# Patient Record
Sex: Male | Born: 2000 | Race: Black or African American | Hispanic: No | Marital: Single | Smoking: Current every day smoker
Health system: Southern US, Community
[De-identification: ages and names within clinical notes are randomized; demographics above are authoritative.]

## PROBLEM LIST (undated history)

## (undated) DIAGNOSIS — I10 Essential (primary) hypertension: Secondary | ICD-10-CM

## (undated) DIAGNOSIS — R011 Cardiac murmur, unspecified: Secondary | ICD-10-CM

## (undated) HISTORY — PX: NO PAST SURGERIES: SHX2092

## (undated) HISTORY — DX: Cardiac murmur, unspecified: R01.1

---
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
## 2020-01-31 ENCOUNTER — Ambulatory Visit
Admission: EM | Admit: 2020-01-31 | Discharge: 2020-01-31 | Disposition: A | Discharge status: Home / Self Care | Payer: Medicaid Other | Attending: Emergency Medicine | Admitting: Emergency Medicine

## 2020-01-31 ENCOUNTER — Other Ambulatory Visit: Payer: Self-pay

## 2020-01-31 DIAGNOSIS — L0291 Cutaneous abscess, unspecified: Secondary | ICD-10-CM

## 2020-01-31 HISTORY — DX: Essential (primary) hypertension: I10

## 2020-01-31 MED ORDER — SULFAMETHOXAZOLE-TRIMETHOPRIM 800-160 MG PO TABS: 1.0000 | ORAL_TABLET | Freq: Two times a day (BID) | ORAL | 0 refills | Status: AC

## 2020-01-31 NOTE — ED Triage Notes (Signed)
Patient complains of lump on left testicle x 2-3 days. Patient states that area is painful worse to touch.

## 2020-01-31 NOTE — Discharge Instructions (Signed)
Take the antibiotic as directed.  Encourage drainage from your abscess.    Keep your wound clean and dry.  Wash it gently twice a day with soap and water.      Return here if you see signs of worsening infection, such as increased pain, redness, warmth, fever, chills, or other concerning symptoms.    Follow up with your primary care provider or return here if your abscess is not improving.

## 2020-01-31 NOTE — ED Provider Notes (Signed)
[facility]    CSN: [id number] Arrival date & time: 01/31/20  1536      History   Chief Complaint Chief Complaint  Patient presents with  . Testicle Pain    left    HPI Darren Jones is a 19 y.o. male.   Patient presents with a "lump" on his left scrotum x 3 days.  He states it is painful to touch.  He denies fever or chills.  No drainage from the area.  No penile discharge or testicular pain.  No abdominal pain, dysuria, back pain.  No other symptoms.  No treatments attempted at home.   The history is provided by the patient.    Past Medical History:  Diagnosis Date  . Hypertension     There are no problems to display for this patient.   Past Surgical History:  Procedure Laterality Date  . NO PAST SURGERIES         Home Medications    Prior to Admission medications   Medication Sig Start Date End Date Taking? Authorizing Provider  sulfamethoxazole-trimethoprim (BACTRIM DS) 800-160 MG tablet Take 1 tablet by mouth 2 (two) times daily for 7 days. 01/31/20 02/07/20  Mickie Bail, NP    Family History Family History  Problem Relation Age of Onset  . Healthy Mother   . Hypertension Father   . Diabetes Father     Social History Social History   Tobacco Use  . Smoking status: Current Every Day Smoker    Packs/day: 0.50    Types: Cigarettes  . Smokeless tobacco: Never Used  Substance Use Topics  . Alcohol use: Never  . Drug use: Never     Allergies   Patient has no known allergies.   Review of Systems Review of Systems  Constitutional: Negative for chills and fever.  HENT: Negative for ear pain and sore throat.   Eyes: Negative for pain and visual disturbance.  Respiratory: Negative for cough and shortness of breath.   Cardiovascular: Negative for chest pain and palpitations.  Gastrointestinal: Negative for abdominal pain and vomiting.  Genitourinary: Negative for dysuria and hematuria.  Musculoskeletal: Negative for arthralgias and  back pain.  Skin: Positive for wound. Negative for color change and rash.  Neurological: Negative for seizures, syncope, weakness and numbness.  All other systems reviewed and are negative.    Physical Exam Triage Vital Signs ED Triage Vitals  Enc Vitals Group     BP      Pulse      Resp      Temp      Temp src      SpO2      Weight      Height      Head Circumference      Peak Flow      Pain Score      Pain Loc      Pain Edu?      Excl. in [place]?    No data found.  Updated Vital Signs BP (!) 146/88 (BP Location: Left Arm)   Pulse 77   Temp 99.1 F (37.3 C) (Oral)   Resp 18   Ht 5\' 7"  (1.702 m)   Wt 128 lb (58.1 kg)   SpO2 98%   BMI 20.05 kg/m   Visual Acuity Right Eye Distance:   Left Eye Distance:   Bilateral Distance:    Right Eye Near:   Left Eye Near:    Bilateral Near:  Physical Exam Vitals and nursing note reviewed.  Constitutional:      Appearance: He is well-developed.  HENT:     Head: Normocephalic and atraumatic.  Eyes:     Conjunctiva/sclera: Conjunctivae normal.  Cardiovascular:     Rate and Rhythm: Normal rate and regular rhythm.     Heart sounds: No murmur.  Pulmonary:     Effort: Pulmonary effort is normal. No respiratory distress.     Breath sounds: Normal breath sounds.  Abdominal:     Palpations: Abdomen is soft.     Tenderness: There is no abdominal tenderness.  Genitourinary:    Penis: Normal.      Testes: Normal.  Musculoskeletal:     Cervical back: Neck supple.  Skin:    General: Skin is warm and dry.     Findings: No rash.     Comments: 2 cm abscess on left scrotum; tender to palpation; no wounds or drainage.    Neurological:     General: No focal deficit present.     Mental Status: He is alert and oriented to person, place, and time.  Psychiatric:        Mood and Affect: Mood normal.        Behavior: Behavior normal.      UC Treatments / Results  Labs (all labs ordered are listed, but only abnormal results  are displayed) Labs Reviewed - No data to display  EKG   Radiology No results found.  Procedures Incision and Drainage  Date/Time: 01/31/2020 4:02 PM Performed by: Mickie Bail, NP Authorized by: Mickie Bail, NP   Consent:    Consent obtained:  Verbal   Consent given by:  Patient   Risks discussed:  Incomplete drainage and bleeding Location:    Type:  Abscess   Location:  Anogenital   Anogenital location:  Scrotal space Pre-procedure details:    Skin preparation:  Antiseptic wash Anesthesia (see MAR for exact dosages):    Anesthesia method:  Local infiltration   Local anesthetic:  Lidocaine 2% w/o epi Procedure details:    Needle aspiration: yes     Needle size:  25 G   Incision types:  Single straight   Incision depth:  Dermal   Scalpel blade:  11   Drainage:  Purulent   Drainage amount:  Moderate   Wound treatment:  Wound left open   Packing materials:  None Post-procedure details:    Patient tolerance of procedure:  Tolerated well, no immediate complications   (including critical care time)  Medications Ordered in UC Medications - No data to display  Initial Impression / Assessment and Plan / UC Course  I have reviewed the triage vital signs and the nursing notes.  Pertinent labs & imaging results that were available during my care of the patient were reviewed by me and considered in my medical decision making (see chart for details).   Scrotal abscess.  I&D performed.  Treating with Septra DS.  Wound care instructions and signs of worsening infection discussed with patient.  Instructed him to follow-up with his PCP or return here if the abscess is not improving.  Patient agrees to plan of care.     Final Clinical Impressions(s) / UC Diagnoses   Final diagnoses:  Abscess     Discharge Instructions     Take the antibiotic as directed.  Encourage drainage from your abscess.    Keep your wound clean and dry.  Wash it gently twice a day with  soap and  water.      Return here if you see signs of worsening infection, such as increased pain, redness, warmth, fever, chills, or other concerning symptoms.    Follow up with your primary care provider or return here if your abscess is not improving.           ED Prescriptions    Medication Sig Dispense Auth. Provider   sulfamethoxazole-trimethoprim (BACTRIM DS) 800-160 MG tablet Take 1 tablet by mouth 2 (two) times daily for 7 days. 14 tablet Sharion Balloon, NP     PDMP not reviewed this encounter.   Sharion Balloon, NP 01/31/20 1625

## 2020-04-24 ENCOUNTER — Ambulatory Visit
Admission: EM | Admit: 2020-04-24 | Discharge: 2020-04-24 | Disposition: A | Discharge status: Home / Self Care | Payer: Medicaid Other | Attending: Emergency Medicine | Admitting: Emergency Medicine

## 2020-04-24 ENCOUNTER — Other Ambulatory Visit: Payer: Self-pay

## 2020-04-24 DIAGNOSIS — L0291 Cutaneous abscess, unspecified: Secondary | ICD-10-CM

## 2020-04-24 MED ORDER — DOXYCYCLINE HYCLATE 100 MG PO CAPS: 100.0000 mg | ORAL_CAPSULE | Freq: Two times a day (BID) | ORAL | 0 refills | Status: DC

## 2020-04-24 NOTE — ED Provider Notes (Signed)
[facility]    CSN: [id number] Arrival date & time: 04/24/20  1117      History   Chief Complaint Chief Complaint  Patient presents with  . Mass    HPI Carman Gastelum is a 19 y.o. male.   Patient presents with an abscess on his left upper thigh beside his scrotum x2 days.  He states the area is acutely painful to touch and when walking.  He previously had an abscess in the same area on 01/31/2020.  He denies fever, chills, drainage, wounds, rash, redness, abdominal pain, dysuria, penile discharge, testicular pain, or other symptoms.  The history is provided by the patient.    Past Medical History:  Diagnosis Date  . Hypertension     There are no problems to display for this patient.   Past Surgical History:  Procedure Laterality Date  . NO PAST SURGERIES         Home Medications    Prior to Admission medications   Medication Sig Start Date End Date Taking? Authorizing Provider  doxycycline (VIBRAMYCIN) 100 MG capsule Take 1 capsule (100 mg total) by mouth 2 (two) times daily. 04/24/20   Sharion Balloon, NP    Family History Family History  Problem Relation Age of Onset  . Healthy Mother   . Hypertension Father   . Diabetes Father     Social History Social History   Tobacco Use  . Smoking status: Current Every Day Smoker    Packs/day: 0.50    Types: Cigarettes  . Smokeless tobacco: Never Used  Substance Use Topics  . Alcohol use: Never  . Drug use: Never     Allergies   Patient has no known allergies.   Review of Systems Review of Systems  Constitutional: Negative for chills and fever.  HENT: Negative for ear pain and sore throat.   Eyes: Negative for pain and visual disturbance.  Respiratory: Negative for cough and shortness of breath.   Cardiovascular: Negative for chest pain and palpitations.  Gastrointestinal: Negative for abdominal pain and vomiting.  Genitourinary: Negative for dysuria and hematuria.  Musculoskeletal: Negative for  arthralgias and back pain.  Skin: Positive for wound. Negative for color change and rash.  Neurological: Negative for seizures, syncope, weakness and numbness.  All other systems reviewed and are negative.    Physical Exam Triage Vital Signs ED Triage Vitals  Enc Vitals Group     BP 04/24/20 1126 (!) 141/79     Pulse Rate 04/24/20 1126 89     Resp 04/24/20 1126 14     Temp 04/24/20 1126 99.2 F (37.3 C)     Temp Source 04/24/20 1126 Oral     SpO2 04/24/20 1126 98 %     Weight 04/24/20 1125 130 lb (59 kg)     Height 04/24/20 1125 5\' 7"  (1.702 m)     Head Circumference --      Peak Flow --      Pain Score 04/24/20 1124 7     Pain Loc --      Pain Edu? --      Excl. in [place]? --    No data found.  Updated Vital Signs BP (!) 141/79 (BP Location: Left Arm)   Pulse 89   Temp 99.2 F (37.3 C) (Oral)   Resp 14   Ht 5\' 7"  (1.702 m)   Wt 130 lb (59 kg)   SpO2 98%   BMI 20.36 kg/m   Visual Acuity Right  Eye Distance:   Left Eye Distance:   Bilateral Distance:    Right Eye Near:   Left Eye Near:    Bilateral Near:     Physical Exam Vitals and nursing note reviewed.  Constitutional:      Appearance: He is well-developed.  HENT:     Head: Normocephalic and atraumatic.     Mouth/Throat:     Mouth: Mucous membranes are moist.  Eyes:     Conjunctiva/sclera: Conjunctivae normal.  Cardiovascular:     Rate and Rhythm: Normal rate and regular rhythm.     Heart sounds: No murmur.  Pulmonary:     Effort: Pulmonary effort is normal. No respiratory distress.     Breath sounds: Normal breath sounds.  Abdominal:     Palpations: Abdomen is soft.     Tenderness: There is no abdominal tenderness. There is no guarding or rebound.  Musculoskeletal:     Cervical back: Neck supple.  Skin:    General: Skin is warm and dry.     Findings: Lesion present. No erythema.     Comments: Abscess on left upper thigh beside scrotum: 4 cm x 2 cm; Acutely tender to touch; Firm, non-fluctuant.  No erythema, wounds, drainage, rash.    Neurological:     General: No focal deficit present.     Mental Status: He is alert and oriented to person, place, and time.     Sensory: No sensory deficit.     Motor: No weakness.  Psychiatric:        Mood and Affect: Mood normal.        Behavior: Behavior normal.      UC Treatments / Results  Labs (all labs ordered are listed, but only abnormal results are displayed) Labs Reviewed - No data to display  EKG   Radiology No results found.  Procedures Procedures (including critical care time)  Medications Ordered in UC Medications - No data to display  Initial Impression / Assessment and Plan / UC Course  I have reviewed the triage vital signs and the nursing notes.  Pertinent labs & imaging results that were available during my care of the patient were reviewed by me and considered in my medical decision making (see chart for details).   Abscess on left upper thigh beside scrotum.  Patient declines I&D today; he states it is too painful.  Discussed treatment options.  Patient will call surgeon to schedule appointment as soon as possible.  Treating with doxycycline as patient was treated with Septra in March.  Patient agrees to plan of care.      Final Clinical Impressions(s) / UC Diagnoses   Final diagnoses:  Abscess     Discharge Instructions     Take the antibiotic as directed.    Call the surgeon listed below to schedule an appointment as soon as possible.        ED Prescriptions    Medication Sig Dispense Auth. Provider   doxycycline (VIBRAMYCIN) 100 MG capsule Take 1 capsule (100 mg total) by mouth 2 (two) times daily. 20 capsule Mickie Bail, NP     PDMP not reviewed this encounter.   Mickie Bail, NP 04/24/20 [phone]

## 2020-04-24 NOTE — ED Triage Notes (Signed)
Patient states that he noticed a lump on is left thigh 2 days ago. States that the area is painful.

## 2020-04-24 NOTE — Discharge Instructions (Addendum)
Take the antibiotic as directed.    Call the surgeon listed below to schedule an appointment as soon as possible.

## 2020-04-26 ENCOUNTER — Encounter: Payer: Self-pay | Admitting: Surgery

## 2020-04-26 ENCOUNTER — Ambulatory Visit (INDEPENDENT_AMBULATORY_CARE_PROVIDER_SITE_OTHER): Payer: Medicaid Other | Admitting: Surgery

## 2020-04-26 ENCOUNTER — Other Ambulatory Visit: Payer: Self-pay

## 2020-04-26 VITALS — BP 142/87 | HR 80 | Temp 97.9°F | Resp 12 | Ht 67.0 in | Wt 123.4 lb

## 2020-04-26 DIAGNOSIS — L02214 Cutaneous abscess of groin: Secondary | ICD-10-CM

## 2020-04-26 HISTORY — PX: INCISION AND DRAINAGE ABSCESS: SHX5864

## 2020-04-26 NOTE — Patient Instructions (Addendum)
Today we have drained your Abscess in the office. Keep the area clean and open. Change the gauze as needed to help with drainage. Continue the antibiotics.  We will see you back as scheduled below.   Incision and Drainage Incision and drainage is a surgical procedure to open and drain a fluid-filled sac. The sac may be filled with pus, mucus, or blood. Examples of fluid-filled sacs that may need surgical drainage include cysts, skin infections (abscesses), and red lumps that develop from a ruptured cyst or a small abscess (boils). You may need this procedure if the affected area is large, painful, infected, or not healing well. Tell a health care provider about:  Any allergies you have.  All medicines you are taking, including vitamins, herbs, eye drops, creams, and over-the-counter medicines.  Any problems you or family members have had with anesthetic medicines.  Any blood disorders you have.  Any surgeries you have had.  Any medical conditions you have.  Whether you are pregnant or may be pregnant. What are the risks? Generally, this is a safe procedure. However, problems may occur, including:  Infection.  Bleeding.  Allergic reactions to medicines.  Scarring.  What happens before the procedure?  You may need an ultrasound or other imaging tests to see how large or deep the fluid-filled sac is.  You may have blood tests to check for infection.  You may get a tetanus shot.  You may be given antibiotic medicine to help prevent infection.  Follow instructions from your health care provider about eating or drinking restrictions.  Ask your health care provider about: ? Changing or stopping your regular medicines. This is especially important if you are taking diabetes medicines or blood thinners. ? Taking medicines such as aspirin and ibuprofen. These medicines can thin your blood. Do not take these medicines before your procedure if your health care provider instructs  you not to.  Plan to have someone take you home after the procedure.  If you will be going home right after the procedure, plan to have someone stay with you for 24 hours. What happens during the procedure?  To reduce your risk of infection: ? Your health care team will wash or sanitize their hands. ? Your skin will be washed with soap.  You will be given one or more of the following: ? A medicine to help you relax (sedative). ? A medicine to numb the area (local anesthetic). ? A medicine to make you fall asleep (general anesthetic).  An incision will be made in the top of the fluid-filled sac.  The contents of the sac may be squeezed out, or a syringe or tube (catheter)may be used to empty the sac.  The catheter may be left in place for several weeks to drain any fluid. Or, your health care provider may stitch open the edges of the incision to make a long-term opening for drainage (marsupialization).  The inside of the sac may be washed out (irrigated) with a sterile solution and packed with gauze before it is covered with a bandage (dressing). The procedure may vary among health care providers and hospitals. What happens after the procedure?  Your blood pressure, heart rate, breathing rate, and blood oxygen level will be monitored often until the medicines you were given have worn off.  Do not drive for 24 hours if you received a sedative. This information is not intended to replace advice given to you by your health care provider. Make sure you discuss any questions  you have with your health care provider. Document Released: 05/06/2001 Document Revised: 04/17/2016 Document Reviewed: 08/31/2015 Elsevier Interactive Patient Education  2017 [employer].   Incision and Drainage, Care After Refer to this sheet in the next few weeks. These instructions provide you with information about caring for yourself after your procedure. Your health care provider may also give you more  specific instructions. Your treatment has been planned according to current medical practices, but problems sometimes occur. Call your health care provider if you have any problems or questions after your procedure. What can I expect after the procedure? After the procedure, it is common to have:  Pain or discomfort around your incision site.  Drainage from your incision.  Follow these instructions at home:  Take over-the-counter and prescription medicines only as told by your health care provider.  If you were prescribed an antibiotic medicine, take it as told by your health care provider.Do not stop taking the antibiotic even if you start to feel better.  Followinstructions from your health care provider about: ? How to take care of your incision. ? When and how you should change your packing and bandage (dressing). Wash your hands with soap and water before you change your dressing. If soap and water are not available, use hand sanitizer. ? When you should remove your dressing.  Do not take baths, swim, or use a hot tub until your health care provider approves.  Keep all follow-up visits as told by your health care provider. This is important.  Check your incision area every day for signs of infection. Check for: ? More redness, swelling, or pain. ? More fluid or blood. ? Warmth. ? Pus or a bad smell. Contact a health care provider if:  Your cyst or abscess returns.  You have a fever.  You have more redness, swelling, or pain around your incision.  You have more fluid or blood coming from your incision.  Your incision feels warm to the touch.  You have pus or a bad smell coming from your incision. Get help right away if:  You have severe pain or bleeding.  You cannot eat or drink without vomiting.  You have decreased urine output.  You become short of breath.  You have chest pain.  You cough up blood.  The area where the incision and drainage occurred  becomes numb or it tingles. This information is not intended to replace advice given to you by your health care provider. Make sure you discuss any questions you have with your health care provider. Document Released: 02/02/2012 Document Revised: 04/11/2016 Document Reviewed: 08/31/2015 Elsevier Interactive Patient Education  2017 [employer].

## 2020-04-26 NOTE — Progress Notes (Signed)
Patient ID: Darren Jones, male   DOB: [birth year], 19 y.o.   MRN: [id number]  Chief Complaint: Left medial proximal thigh abscess.  History of Present Illness Darren Jones is a 19 y.o. male with a prior history of an I&D last March of a reported abscess of his genitals, he underwent I&D which was quite traumatic under local and was prescribed Bactrim.  He now reports a progressive abscess of his left medial thigh near the area of his groin, without drainage, increasingly swollen and tender and painful.  He denies fevers and chills.  Was seen in the urgent care on June 1 and started taking his doxycycline then.  Warm compresses have been utilized without much improvement.  Presenting today.  Past Medical History Past Medical History:  Diagnosis Date  . Heart murmur   . Hypertension       Past Surgical History:  Procedure Laterality Date  . NO PAST SURGERIES      No Known Allergies  Current Outpatient Medications  Medication Sig Dispense Refill  . doxycycline (VIBRAMYCIN) 100 MG capsule Take 1 capsule (100 mg total) by mouth 2 (two) times daily. 20 capsule 0  . lisinopril (ZESTRIL) 5 MG tablet Take by mouth.     No current facility-administered medications for this visit.    Family History Family History  Problem Relation Age of Onset  . Healthy Mother   . Hypertension Father   . Diabetes Father       Social History Social History   Tobacco Use  . Smoking status: Former Smoker    Packs/day: 0.50    Types: Cigarettes    Quit date: 01/25/2020    Years since quitting: 0.2  . Smokeless tobacco: Never Used  Substance Use Topics  . Alcohol use: Never  . Drug use: Yes    Types: Marijuana        Review of Systems  Constitutional: Negative for chills and fever.  HENT: Negative.   Eyes: Negative.   Respiratory: Negative.   Cardiovascular: Negative.   Gastrointestinal: Negative.   Genitourinary: Negative.   Musculoskeletal: Negative.   Skin: Negative for itching and  rash.  Neurological: Negative.   Endo/Heme/Allergies: Negative.       Physical Exam Blood pressure (!) 142/87, pulse 80, temperature 97.9 F (36.6 C), resp. rate 12, height 5\' 7"  (1.702 m), weight 123 lb 6.4 oz (56 kg), SpO2 98 %. Last Weight  Most recent update: 04/26/2020 11:29 AM   Weight  56 kg (123 lb 6.4 oz)            CONSTITUTIONAL: Well developed, and nourished, appropriately responsive and aware without distress.   EYES: Sclera non-icteric.   EARS, NOSE, MOUTH AND THROAT: Mask worn.   Hearing is intact to voice.  NECK: Trachea is midline, and there is no jugular venous distension.  LYMPH NODES:  Lymph nodes in the neck are not enlarged. RESPIRATORY:  Lungs are clear, and breath sounds are equal bilaterally. Normal respiratory effort without pathologic use of accessory muscles. CARDIOVASCULAR: Heart is regular in rate and rhythm. GI: The abdomen is soft, nontender, and nondistended.  MUSCULOSKELETAL:  Symmetrical muscle tone appreciated in all four extremities.    SKIN: There is a well-defined tender fluctuant mass of the proximal medial left thigh about 2-1/2 cm in size with surrounding induration.  There is no evidence of drainage or soon to spontaneously drain areas of skin present. NEUROLOGIC:  Motor and sensation appear grossly normal.  Cranial nerves are grossly without  defect. PSYCH:  Alert and oriented to person, place and time. Affect is appropriate for situation.  Data Reviewed I have personally reviewed what is currently available of the patient's imaging, recent labs and medical records.   Labs:  No flowsheet data found. CMP Latest Ref Rng & Units 12/10/2018  Creatinine 0.50 - 1.00 mg/dL 1.10(H)      Imaging:  Within last 24 hrs: No results found.  Assessment    Left proximal thigh abscess. There are no problems to display for this patient.   Plan    After extensive discussion patient is willing to undergo I&D.  Left groin was prepped with  ChloraPrep, draped.  After thorough review, we both agreed to proceed without local anesthetic, utilizing a quick lance approach with an 11 blade.  This was discussed thoroughly prior to initiating this procedure.  Patient desires to proceed with this.  Almyra Free is present as chaperone.  An 11 blade was used to perform the quick glance of about 2 cm in length, obtaining immediate purulent drainage.  Without further probing or packing, we mopped up the purulent drainage.  We then applied a new dry gauze. Patient was given instructions to shower and cleanse twice daily, to keep the wound open, and to keep the wound covered to protect his clothing.  He knows he is to continue his doxycycline.  He will follow up with [place] in 2 weeks.  Face-to-face time spent with the patient and accompanying care providers(if present) was 30 minutes, with more than 50% of the time spent counseling, educating, and coordinating care of the patient.      Ronny Bacon M.D., FACS 04/26/2020, 12:42 PM

## 2020-05-10 ENCOUNTER — Encounter: Payer: Medicaid Other | Admitting: Surgery

## 2020-05-17 ENCOUNTER — Ambulatory Visit (INDEPENDENT_AMBULATORY_CARE_PROVIDER_SITE_OTHER): Payer: Medicaid Other | Admitting: Surgery

## 2020-05-17 ENCOUNTER — Encounter: Payer: Self-pay | Admitting: Surgery

## 2020-05-17 ENCOUNTER — Other Ambulatory Visit: Payer: Self-pay

## 2020-05-17 VITALS — BP 148/92 | HR 52 | Temp 97.7°F | Resp 12 | Ht 67.0 in | Wt 123.6 lb

## 2020-05-17 DIAGNOSIS — L02214 Cutaneous abscess of groin: Secondary | ICD-10-CM

## 2020-05-17 NOTE — Progress Notes (Signed)
[facility] POST-OP OFFICE VISIT  05/17/2020  HPI: Darren Jones is a 20 y.o. male 3 weeks s/p I&D of left thigh abscess.  He still feels something in the area.  Denies pain.  Has not been packing it for over a week.  Denies fevers, chills and drainage.  Vital signs: BP (!) 148/92   Pulse (!) 52   Temp 97.7 F (36.5 C)   Resp 12   Ht 5\' 7"  (1.702 m)   Wt 123 lb 9.6 oz (56.1 kg)   SpO2 99%   BMI 19.36 kg/m    Physical Exam: Constitutional: He appears well, healthy, nontoxic. Skin: Left proximal thigh area incision is well approximated and healed, there is no underlying induration.  Difficult to appreciate the scar at all.  There is no evidence of any tenderness.  Assessment/Plan: This is a 19 y.o. male 3 weeks s/p I&D left thigh abscess  Patient Active Problem List   Diagnosis Date Noted  . Abscess of left groin 04/26/2020    -Reassurance is given, can follow-up as needed.   06/26/2020 M.D., FACS 05/17/2020, 1:05 PM

## 2020-05-17 NOTE — Patient Instructions (Signed)
Follow up as needed, call the office if you have any questions or concerns.  

## 2020-07-16 ENCOUNTER — Ambulatory Visit
Admission: EM | Admit: 2020-07-16 | Discharge: 2020-07-16 | Disposition: A | Discharge status: Home / Self Care | Payer: Medicaid Other

## 2020-07-16 DIAGNOSIS — J22 Unspecified acute lower respiratory infection: Secondary | ICD-10-CM | POA: Diagnosis not present

## 2020-07-16 DIAGNOSIS — R059 Cough, unspecified: Secondary | ICD-10-CM

## 2020-07-16 DIAGNOSIS — R05 Cough: Secondary | ICD-10-CM

## 2020-07-16 MED ORDER — AZITHROMYCIN 250 MG PO TABS: 250.0000 mg | ORAL_TABLET | Freq: Every day | ORAL | 0 refills | Status: DC

## 2020-07-16 MED ORDER — ALBUTEROL SULFATE HFA 108 (90 BASE) MCG/ACT IN AERS: 2.0000 | INHALATION_SPRAY | RESPIRATORY_TRACT | 0 refills | Status: DC | PRN

## 2020-07-16 NOTE — ED Provider Notes (Signed)
Darren Jones    CSN: [id number] Arrival date & time: 07/16/20  1727      History   Chief Complaint Chief Complaint  Patient presents with  . Cough  . Shortness of Breath  . Fever  . Diarrhea  . Nasal Congestion    HPI Darren Jones is a 19 y.o. male.   Patient presents with fever, cough, mild shortness of breath with exertion, nasal congestion, diarrhea x6 days.  He has taken Tylenol with moderate relief.  He denies ear pain, sore throat, abdominal pain, vomiting, rash, or other symptoms. Patient reports history of asthma as a child but has not required treatment since childhood.  His medical history is also significant for hypertension and heart murmur.  The history is provided by the patient.    Past Medical History:  Diagnosis Date  . Heart murmur   . Hypertension     There are no problems to display for this patient.   Past Surgical History:  Procedure Laterality Date  . NO PAST SURGERIES         Home Medications    Prior to Admission medications   Medication Sig Start Date End Date Taking? Authorizing Provider  acetaminophen (TYLENOL) 500 MG tablet Take 500 mg by mouth every 6 (six) hours as needed.   Yes [provider]  albuterol (VENTOLIN HFA) 108 (90 Base) MCG/ACT inhaler Inhale 2 puffs into the lungs every 4 (four) hours as needed for wheezing or shortness of breath. 07/16/20   Mickie Bail, NP  azithromycin (ZITHROMAX) 250 MG tablet Take 1 tablet (250 mg total) by mouth daily. Take first 2 tablets together, then 1 every day until finished. 07/16/20   Mickie Bail, NP  doxycycline (VIBRAMYCIN) 100 MG capsule Take 1 capsule (100 mg total) by mouth 2 (two) times daily. 04/24/20   Mickie Bail, NP  lisinopril (ZESTRIL) 5 MG tablet Take by mouth. 02/10/19   [provider]    Family History Family History  Problem Relation Age of Onset  . Healthy Mother   . Hypertension Father   . Diabetes Father     Social History Social  History   Tobacco Use  . Smoking status: Former Smoker    Packs/day: 0.50    Types: Cigarettes    Quit date: 01/25/2020    Years since quitting: 0.4  . Smokeless tobacco: Never Used  Vaping Use  . Vaping Use: Never used  Substance Use Topics  . Alcohol use: Never  . Drug use: Yes    Types: Marijuana     Allergies   Patient has no known allergies.   Review of Systems Review of Systems  Constitutional: Positive for fever. Negative for chills.  HENT: Positive for congestion. Negative for ear pain and sore throat.   Eyes: Negative for pain and visual disturbance.  Respiratory: Positive for cough and shortness of breath.   Cardiovascular: Negative for chest pain and palpitations.  Gastrointestinal: Positive for diarrhea. Negative for abdominal pain, nausea and vomiting.  Genitourinary: Negative for dysuria and hematuria.  Musculoskeletal: Negative for arthralgias and back pain.  Skin: Negative for color change and rash.  Neurological: Negative for seizures and syncope.  All other systems reviewed and are negative.    Physical Exam Triage Vital Signs ED Triage Vitals  Enc Vitals Group     BP 07/16/20 1731 137/76     Pulse Rate 07/16/20 1731 75     Resp 07/16/20 1731 18  Temp 07/16/20 1731 98.2 F (36.8 C)     Temp Source 07/16/20 1731 Oral     SpO2 07/16/20 1731 98 %     Weight --      Height --      Head Circumference --      Peak Flow --      Pain Score 07/16/20 1729 0     Pain Loc --      Pain Edu? --      Excl. in GC? --    No data found.  Updated Vital Signs BP 137/76 (BP Location: Left Arm)   Pulse 75   Temp 98.2 F (36.8 C) (Oral)   Resp 18   SpO2 98%   Visual Acuity Right Eye Distance:   Left Eye Distance:   Bilateral Distance:    Right Eye Near:   Left Eye Near:    Bilateral Near:     Physical Exam Vitals and nursing note reviewed.  Constitutional:      General: He is not in acute distress.    Appearance: He is well-developed. He is  not ill-appearing.  HENT:     Head: Normocephalic and atraumatic.     Right Ear: Tympanic membrane normal.     Left Ear: Tympanic membrane normal.     Nose: Nose normal.     Mouth/Throat:     Mouth: Mucous membranes are moist.     Pharynx: Oropharynx is clear.  Eyes:     Conjunctiva/sclera: Conjunctivae normal.  Cardiovascular:     Rate and Rhythm: Normal rate and regular rhythm.     Heart sounds: No murmur heard.   Pulmonary:     Effort: Pulmonary effort is normal. No respiratory distress.     Breath sounds: Rhonchi present. No wheezing.     Comments: Scattered rhonchi throughout; partially clears with cough. No respiratory distress.  O2 sat 98% Abdominal:     Palpations: Abdomen is soft.     Tenderness: There is no abdominal tenderness. There is no guarding or rebound.  Musculoskeletal:     Cervical back: Neck supple.  Skin:    General: Skin is warm and dry.     Findings: No rash.  Neurological:     General: No focal deficit present.     Mental Status: He is alert and oriented to person, place, and time.     Gait: Gait normal.  Psychiatric:        Mood and Affect: Mood normal.        Behavior: Behavior normal.      UC Treatments / Results  Labs (all labs ordered are listed, but only abnormal results are displayed) Labs Reviewed  NOVEL CORONAVIRUS, NAA    EKG   Radiology No results found.  Procedures Procedures (including critical care time)  Medications Ordered in UC Medications - No data to display  Initial Impression / Assessment and Plan / UC Course  I have reviewed the triage vital signs and the nursing notes.  Pertinent labs & imaging results that were available during my care of the patient were reviewed by me and considered in my medical decision making (see chart for details).   Lower respiratory tract infection.  Treating with Zithromax and albuterol inhaler.  PCR COVID pending.  Instructed patient to self quarantine until the test result is  back.  Discussed symptomatic treatment with Tylenol, rest, hydration.  Instructed patient to go to the ED if he has acute worsening symptoms, including shortness of  breath.  Patient agrees to plan of care.   Final Clinical Impressions(s) / UC Diagnoses   Final diagnoses:  Lower respiratory infection     Discharge Instructions     Take the antibiotic as directed.    Your COVID test is pending.  You should self quarantine until the test result is back.    Take Tylenol as needed for fever or discomfort.  Rest and keep yourself hydrated.    Go to the emergency department if you develop acute worsening symptoms.        ED Prescriptions    Medication Sig Dispense Auth. Provider   azithromycin (ZITHROMAX) 250 MG tablet Take 1 tablet (250 mg total) by mouth daily. Take first 2 tablets together, then 1 every day until finished. 6 tablet Mickie Bail, NP   albuterol (VENTOLIN HFA) 108 (90 Base) MCG/ACT inhaler Inhale 2 puffs into the lungs every 4 (four) hours as needed for wheezing or shortness of breath. 18 g Mickie Bail, NP     PDMP not reviewed this encounter.   Mickie Bail, NP 07/16/20 [phone]

## 2020-07-16 NOTE — ED Triage Notes (Signed)
Pt presents for COVID testing. States having diarrhea, fever, cough, SOB on exertion, and nasal congestion x 6 days. Tylenol gives somewhat relief.

## 2020-07-16 NOTE — Discharge Instructions (Signed)
Take the antibiotic as directed.    Your COVID test is pending.  You should self quarantine until the test result is back.    Take Tylenol as needed for fever or discomfort.  Rest and keep yourself hydrated.    Go to the emergency department if you develop acute worsening symptoms.

## 2020-07-18 LAB — SARS-COV-2, NAA 2 DAY TAT

## 2020-07-18 LAB — NOVEL CORONAVIRUS, NAA: SARS-CoV-2, NAA: NOT DETECTED

## 2020-08-09 ENCOUNTER — Ambulatory Visit
Admission: EM | Admit: 2020-08-09 | Discharge: 2020-08-09 | Disposition: A | Discharge status: Home / Self Care | Payer: Medicaid Other | Attending: Emergency Medicine | Admitting: Emergency Medicine

## 2020-08-09 DIAGNOSIS — N492 Inflammatory disorders of scrotum: Secondary | ICD-10-CM | POA: Insufficient documentation

## 2020-08-09 MED ORDER — DOXYCYCLINE HYCLATE 100 MG PO CAPS: 100.0000 mg | ORAL_CAPSULE | Freq: Two times a day (BID) | ORAL | 0 refills | Status: AC

## 2020-08-09 MED ORDER — MUPIROCIN CALCIUM 2 % NA OINT: TOPICAL_OINTMENT | NASAL | 0 refills | Status: DC

## 2020-08-09 NOTE — ED Triage Notes (Signed)
Patient in today w/ c/o abscess on left side of groin. Patient states sx onset about 1 wk ago. Patient states he has been treated for this before.

## 2020-08-09 NOTE — Discharge Instructions (Addendum)
Apply the Mupirocin ointment to your nose daily.  Take the Doxycycline twice daily for 10 days.  Keep applying warm compresses to the area to promote drainage.   Return in 2 days for re-evaluation.   We will call you when the culture is back.

## 2020-08-09 NOTE — ED Provider Notes (Addendum)
[facility]    CSN: [id number] Arrival date & time: 08/09/20  1134      History   Chief Complaint Chief Complaint  Patient presents with   Abscess    Left groin    HPI Darren Jones is a 19 y.o. male.   20 yo male here for evaluation of abscess on the left side of his scrotum that he noticed 2 weeks ago. He has been applying warm compresses and the abscess is draining. HE describes the drainage as clear to red. He reports that he shaves the area and that this abscess started as a hair bump. He has a history of abscesses, the most recent being on his left inner thigh in June. He had an I&D and then was treated with Doxycycline. No cultures of the wound present in the system  Additionally, the patient has hypertension- was taking Lisinopril- unsure of dose but thinks it was 10 mg, but he is not taking it now. He does smoke black and milds and weed daily.    He is also recovering from pneumonia in August- still has an occasional cough and runny nose. No measured fever but he does report feeling hot from time to time. He also had some epigastric pain after taking antibiotics for his pneumonia that is resolving and now present as occasional cramps. No N/V/D or changes in appetite. No blood in his stool.      Past Medical History:  Diagnosis Date   Heart murmur    Hypertension     There are no problems to display for this patient.   Past Surgical History:  Procedure Laterality Date   NO PAST SURGERIES         Home Medications    Prior to Admission medications   Medication Sig Start Date End Date Taking? Authorizing Provider  acetaminophen (TYLENOL) 500 MG tablet Take 500 mg by mouth every 6 (six) hours as needed.    [provider]  albuterol (VENTOLIN HFA) 108 (90 Base) MCG/ACT inhaler Inhale 2 puffs into the lungs every 4 (four) hours as needed for wheezing or shortness of breath. 07/16/20   Mickie Bail, NP  azithromycin (ZITHROMAX) 250 MG  tablet Take 1 tablet (250 mg total) by mouth daily. Take first 2 tablets together, then 1 every day until finished. 07/16/20   Mickie Bail, NP  doxycycline (VIBRAMYCIN) 100 MG capsule Take 1 capsule (100 mg total) by mouth 2 (two) times daily for 10 days. 08/09/20 08/19/20  Becky Augusta, NP  mupirocin nasal ointment (BACTROBAN) 2 % Apply in each nostril daily and apply to wound 3 times a day. 08/09/20   Becky Augusta, NP  lisinopril (ZESTRIL) 5 MG tablet Take by mouth. 02/10/19 08/09/20  [provider]    Family History Family History  Problem Relation Age of Onset   Healthy Mother    Hypertension Father    Diabetes Father     Social History Social History   Tobacco Use   Smoking status: Former Smoker    Packs/day: 0.50    Types: Cigarettes    Quit date: 01/25/2020    Years since quitting: 0.5   Smokeless tobacco: Never Used  Vaping Use   Vaping Use: Never used  Substance Use Topics   Alcohol use: Never   Drug use: Yes    Types: Marijuana     Allergies   Patient has no known allergies.   Review of Systems Review of Systems  Constitutional: Negative  for activity change, appetite change, chills, diaphoresis and fever.  HENT: Positive for rhinorrhea. Negative for congestion and sore throat.   Respiratory: Positive for cough. Negative for chest tightness, shortness of breath and wheezing.   Cardiovascular: Negative for chest pain and palpitations.  Gastrointestinal: Positive for abdominal pain. Negative for blood in stool, diarrhea, nausea and vomiting.       Describes as cramps in upper abdomen  Genitourinary: Negative.   Musculoskeletal: Negative for gait problem and joint swelling.  Skin: Positive for wound.       Hair bump on left scrotum that is draining.   Neurological: Positive for headaches.       Every once in awhile.   Hematological: Positive for adenopathy.  Psychiatric/Behavioral: Negative.      Physical Exam Triage Vital Signs ED Triage  Vitals  Enc Vitals Group     BP 08/09/20 1201 (!) 153/90     Pulse Rate 08/09/20 1201 65     Resp 08/09/20 1201 16     Temp 08/09/20 1201 98.2 F (36.8 C)     Temp Source 08/09/20 1201 Oral     SpO2 08/09/20 1201 100 %     Weight 08/09/20 1202 135 lb (61.2 kg)     Height 08/09/20 1202 5\' 7"  (1.702 m)     Head Circumference --      Peak Flow --      Pain Score 08/09/20 1202 6     Pain Loc --      Pain Edu? --      Excl. in GC? --    No data found.  Updated Vital Signs BP (!) 153/90 (BP Location: Left Arm)    Pulse 65    Temp 98.2 F (36.8 C) (Oral)    Resp 16    Ht 5\' 7"  (1.702 m)    Wt 135 lb (61.2 kg)    SpO2 100%    BMI 21.14 kg/m   Visual Acuity Right Eye Distance:   Left Eye Distance:   Bilateral Distance:    Right Eye Near:   Left Eye Near:    Bilateral Near:     Physical Exam Constitutional:      Appearance: Normal appearance. He is normal weight.  HENT:     Head: Normocephalic and atraumatic.     Nose: Nose normal.  Cardiovascular:     Rate and Rhythm: Normal rate and regular rhythm.     Pulses: Normal pulses.     Heart sounds: Normal heart sounds.  Pulmonary:     Effort: Pulmonary effort is normal.     Breath sounds: Normal breath sounds.  Abdominal:     General: Abdomen is flat. Bowel sounds are normal.     Palpations: Abdomen is soft.  Genitourinary:    Penis: Normal.      Testes: Normal.     Comments: There is a 2 mm draining abscess on the left scrotum with tan discharge. There is no induration, fluctuance, or redness. Area is mildly tender to palpation on exam.  Musculoskeletal:        General: Normal range of motion.     Cervical back: Normal range of motion and neck supple.  Lymphadenopathy:     Lower Body: Right inguinal adenopathy present. Left inguinal adenopathy present.     Comments: Mild shotty lymphadenopathy bilaterally without tenderness.   Skin:    General: Skin is dry.  Neurological:     General: No focal deficit present.  Mental Status: He is alert and oriented to person, place, and time. Mental status is at baseline.  Psychiatric:        Mood and Affect: Mood normal.        Behavior: Behavior normal.        Thought Content: Thought content normal.        Judgment: Judgment normal.      UC Treatments / Results  Labs (all labs ordered are listed, but only abnormal results are displayed) Labs Reviewed  AEROBIC CULTURE (SUPERFICIAL SPECIMEN)    EKG   Radiology No results found.  Procedures Procedures (including critical care time)  Medications Ordered in UC Medications - No data to display  Initial Impression / Assessment and Plan / UC Course  I have reviewed the triage vital signs and the nursing notes.  Pertinent labs & imaging results that were available during my care of the patient were reviewed by me and considered in my medical decision making (see chart for details).   Patient presents with abscess on left scrotum and a history of recurrent abscesses.   Will obtain culture and treat with antibiotics.  Consider MRSA eradication protocol   Final Clinical Impressions(s) / UC Diagnoses   Final diagnoses:  Abscess of scrotum     Discharge Instructions     Apply the Mupirocin ointment to your nose daily.  Take the Doxycycline twice daily for 10 days.  Keep applying warm compresses to the area to promote drainage.   Return in 2 days for re-evaluation.   We will call you when the culture is back.     ED Prescriptions    Medication Sig Dispense Auth. Provider   doxycycline (VIBRAMYCIN) 100 MG capsule Take 1 capsule (100 mg total) by mouth 2 (two) times daily for 10 days. 20 capsule Becky Augusta, NP   mupirocin nasal ointment (BACTROBAN) 2 % Apply in each nostril daily and apply to wound 3 times a day. 22 g Becky Augusta, NP     PDMP not reviewed this encounter.   Becky Augusta, NP 08/09/20 1310    Becky Augusta, NP 08/09/20 [phone]

## 2020-08-12 LAB — AEROBIC CULTURE W GRAM STAIN (SUPERFICIAL SPECIMEN)
Culture: NORMAL
Special Requests: NORMAL

## 2020-08-28 ENCOUNTER — Other Ambulatory Visit: Payer: Self-pay

## 2020-08-28 ENCOUNTER — Ambulatory Visit
Admission: EM | Admit: 2020-08-28 | Discharge: 2020-08-28 | Disposition: A | Discharge status: Home / Self Care | Payer: Medicaid Other | Attending: Emergency Medicine | Admitting: Emergency Medicine

## 2020-08-28 DIAGNOSIS — N492 Inflammatory disorders of scrotum: Secondary | ICD-10-CM

## 2020-08-28 MED ORDER — SULFAMETHOXAZOLE-TRIMETHOPRIM 800-160 MG PO TABS: 1.0000 | ORAL_TABLET | Freq: Two times a day (BID) | ORAL | 0 refills | Status: AC

## 2020-08-28 NOTE — ED Provider Notes (Signed)
Darren Jones    CSN: [id number] Arrival date & time: 08/28/20  1055      History   Chief Complaint Chief Complaint  Patient presents with  . Abscess    HPI Darren Jones is a 19 y.o. male.   Patient presents with abscess in his right groin x2 days.  The area is painful and swollen.  He reports no drainage or open wounds.  He denies fever, chills, or other symptoms.  He was seen at [facility] urgent care on 08/09/2020; diagnosed with abscess of the scrotum (left); treated with doxycycline and mupirocin.  Patient was previously seen at this urgent care on 04/24/2020 with an abscess on his left upper thigh; treated with doxycycline.  He followed up with general surgeon on 04/26/2020 and 05/17/2020; I&D performed by Dr. Claudine Mouton.  The history is provided by the patient and medical records.    Past Medical History:  Diagnosis Date  . Heart murmur   . Hypertension     There are no problems to display for this patient.   Past Surgical History:  Procedure Laterality Date  . NO PAST SURGERIES         Home Medications    Prior to Admission medications   Medication Sig Start Date End Date Taking? Authorizing Provider  mupirocin nasal ointment (BACTROBAN) 2 % Apply in each nostril daily and apply to wound 3 times a day. 08/09/20   Becky Augusta, NP  sulfamethoxazole-trimethoprim (BACTRIM DS) 800-160 MG tablet Take 1 tablet by mouth 2 (two) times daily for 7 days. 08/28/20 09/04/20  Mickie Bail, NP  albuterol (VENTOLIN HFA) 108 (90 Base) MCG/ACT inhaler Inhale 2 puffs into the lungs every 4 (four) hours as needed for wheezing or shortness of breath. 07/16/20 08/09/20  Mickie Bail, NP  lisinopril (ZESTRIL) 5 MG tablet Take by mouth. 02/10/19 08/09/20  [provider]    Family History Family History  Problem Relation Age of Onset  . Healthy Mother   . Hypertension Father   . Diabetes Father     Social History Social History   Tobacco Use  . Smoking status: Former  Smoker    Packs/day: 0.50    Types: Cigarettes    Quit date: 01/25/2020    Years since quitting: 0.5  . Smokeless tobacco: Never Used  Vaping Use  . Vaping Use: Never used  Substance Use Topics  . Alcohol use: Never  . Drug use: Yes    Types: Marijuana     Allergies   Patient has no known allergies.   Review of Systems Review of Systems  Constitutional: Negative for chills and fever.  HENT: Negative for ear pain and sore throat.   Eyes: Negative for pain and visual disturbance.  Respiratory: Negative for cough and shortness of breath.   Cardiovascular: Negative for chest pain and palpitations.  Gastrointestinal: Negative for abdominal pain and vomiting.  Genitourinary: Negative for dysuria and hematuria.  Musculoskeletal: Negative for arthralgias and back pain.  Skin: Positive for wound. Negative for color change and rash.  Neurological: Negative for seizures and syncope.  All other systems reviewed and are negative.    Physical Exam Triage Vital Signs ED Triage Vitals  Enc Vitals Group     BP 08/28/20 1115 (!) 145/80     Pulse Rate 08/28/20 1115 60     Resp 08/28/20 1115 16     Temp 08/28/20 1115 98.7 F (37.1 C)     Temp src --  SpO2 08/28/20 1115 99 %     Weight --      Height --      Head Circumference --      Peak Flow --      Pain Score 08/28/20 1113 8     Pain Loc --      Pain Edu? --      Excl. in GC? --    No data found.  Updated Vital Signs BP (!) 145/80   Pulse 60   Temp 98.7 F (37.1 C)   Resp 16   SpO2 99%   Visual Acuity Right Eye Distance:   Left Eye Distance:   Bilateral Distance:    Right Eye Near:   Left Eye Near:    Bilateral Near:     Physical Exam Vitals and nursing note reviewed.  Constitutional:      Appearance: He is well-developed.  HENT:     Head: Normocephalic and atraumatic.  Eyes:     Conjunctiva/sclera: Conjunctivae normal.  Cardiovascular:     Rate and Rhythm: Normal rate and regular rhythm.     Heart  sounds: No murmur heard.   Pulmonary:     Effort: Pulmonary effort is normal. No respiratory distress.     Breath sounds: Normal breath sounds.  Abdominal:     Palpations: Abdomen is soft.     Tenderness: There is no abdominal tenderness. There is no guarding or rebound.  Musculoskeletal:     Cervical back: Neck supple.  Skin:    General: Skin is warm and dry.     Findings: Lesion present.     Comments: Right scrotal abscess, approximately 3 cm in size, acutely tender to palpation, slightly fluctuant, no open wounds, no drainage, no erythema.   Needle aspiration with blood return only.   Neurological:     General: No focal deficit present.     Mental Status: He is alert and oriented to person, place, and time.     Gait: Gait normal.  Psychiatric:        Mood and Affect: Mood normal.        Behavior: Behavior normal.      UC Treatments / Results  Labs (all labs ordered are listed, but only abnormal results are displayed) Labs Reviewed - No data to display  EKG   Radiology No results found.  Procedures Procedures (including critical care time)  Medications Ordered in UC Medications - No data to display  Initial Impression / Assessment and Plan / UC Course  I have reviewed the triage vital signs and the nursing notes.  Pertinent labs & imaging results that were available during my care of the patient were reviewed by me and considered in my medical decision making (see chart for details).   Right scrotal abscess.  Abscess is not ready for I&D; aspiration with return of blood only.  Treating with Septra DS and warm compresses.  Instructed patient to call the surgeon's office to schedule the soonest available appointment.  Patient agrees to plan of care.   Final Clinical Impressions(s) / UC Diagnoses   Final diagnoses:  Scrotal abscess     Discharge Instructions     Take the antibiotic as directed.  Apply warm compresses to your scrotal abscess.  Call the  surgeon's office to schedule the soonest available appointment.        ED Prescriptions    Medication Sig Dispense Auth. Provider   sulfamethoxazole-trimethoprim (BACTRIM DS) 800-160 MG tablet Take 1 tablet  by mouth 2 (two) times daily for 7 days. 14 tablet Mickie Bail, NP     PDMP not reviewed this encounter.   Mickie Bail, NP 08/28/20 1144

## 2020-08-28 NOTE — Discharge Instructions (Addendum)
Take the antibiotic as directed.  Apply warm compresses to your scrotal abscess.  Call the surgeon's office to schedule the soonest available appointment.

## 2020-08-28 NOTE — ED Triage Notes (Signed)
Patient reports abscess x2 days in the right groin.

## 2020-08-30 ENCOUNTER — Ambulatory Visit: Payer: Self-pay | Admitting: Surgery

## 2020-09-06 ENCOUNTER — Ambulatory Visit: Payer: Self-pay | Admitting: Surgery

## 2020-09-25 ENCOUNTER — Encounter: Payer: Self-pay | Admitting: Emergency Medicine

## 2020-09-25 ENCOUNTER — Other Ambulatory Visit: Payer: Self-pay

## 2020-09-25 ENCOUNTER — Ambulatory Visit
Admission: EM | Admit: 2020-09-25 | Discharge: 2020-09-25 | Disposition: A | Discharge status: Home / Self Care | Payer: Medicaid Other

## 2020-09-25 DIAGNOSIS — R519 Headache, unspecified: Secondary | ICD-10-CM

## 2020-09-25 DIAGNOSIS — R42 Dizziness and giddiness: Secondary | ICD-10-CM

## 2020-09-25 NOTE — ED Triage Notes (Signed)
Patient c/o dizziness and headache this morning (1140).   Patient states this has never happened before.   Patient states " I think my blood pressure is really high today".   History of Hypertension per patient statement.

## 2020-09-25 NOTE — ED Provider Notes (Signed)
[name]    CSN: [id number] Arrival date & time: 09/25/20  1212      History   Chief Complaint Chief Complaint  Patient presents with  . Dizziness    HPI Terrius Mancil is a 19 y.o. male.   Presents with dizziness and headache since this morning.  No falls or injury.  He reports he used to be on lisinopril for his blood pressure but stopped taking it one year ago.  He denies focal weakness, numbness, palpitations, chest pain, shortness of breath, abdominal pain, lower extremity swelling, or other symptoms.  His medical history includes hypertension and heart murmur.  The history is provided by the patient.    Past Medical History:  Diagnosis Date  . Heart murmur   . Hypertension     There are no problems to display for this patient.   Past Surgical History:  Procedure Laterality Date  . NO PAST SURGERIES         Home Medications    Prior to Admission medications   Medication Sig Start Date End Date Taking? Authorizing Provider  mupirocin nasal ointment (BACTROBAN) 2 % Apply in each nostril daily and apply to wound 3 times a day. 08/09/20   Becky Augusta, NP  albuterol (VENTOLIN HFA) 108 (90 Base) MCG/ACT inhaler Inhale 2 puffs into the lungs every 4 (four) hours as needed for wheezing or shortness of breath. 07/16/20 08/09/20  Mickie Bail, NP  lisinopril (ZESTRIL) 5 MG tablet Take by mouth. 02/10/19 08/09/20  [provider]    Family History Family History  Problem Relation Age of Onset  . Healthy Mother   . Hypertension Father   . Diabetes Father     Social History Social History   Tobacco Use  . Smoking status: Former Smoker    Packs/day: 0.50    Types: Cigarettes    Quit date: 01/25/2020    Years since quitting: 0.6  . Smokeless tobacco: Never Used  Vaping Use  . Vaping Use: Never used  Substance Use Topics  . Alcohol use: Never  . Drug use: Yes    Types: Marijuana     Allergies   Doxycycline   Review of Systems Review  of Systems  Constitutional: Negative for chills and fever.  HENT: Negative for ear pain and sore throat.   Eyes: Negative for pain and visual disturbance.  Respiratory: Negative for cough and shortness of breath.   Cardiovascular: Negative for chest pain and palpitations.  Gastrointestinal: Negative for abdominal pain and vomiting.  Genitourinary: Negative for dysuria and hematuria.  Musculoskeletal: Negative for arthralgias and back pain.  Skin: Negative for color change and rash.  Neurological: Positive for dizziness and headaches. Negative for tremors, seizures, syncope, facial asymmetry, speech difficulty, weakness, light-headedness and numbness.  All other systems reviewed and are negative.    Physical Exam Triage Vital Signs ED Triage Vitals  Enc Vitals Group     BP 09/25/20 1229 (!) 142/86     Pulse Rate 09/25/20 1229 65     Resp 09/25/20 1229 17     Temp 09/25/20 1229 98.1 F (36.7 C)     Temp Source 09/25/20 1229 Oral     SpO2 09/25/20 1229 99 %     Weight 09/25/20 1226 134 lb 14.7 oz (61.2 kg)     Height 09/25/20 1226 5\' 7"  (1.702 m)     Head Circumference --      Peak Flow --      Pain  Score 09/25/20 1226 4     Pain Loc --      Pain Edu? --      Excl. in GC? --    No data found.  Updated Vital Signs BP (!) 142/86 (BP Location: Left Arm)   Pulse 65   Temp 98.1 F (36.7 C) (Oral)   Resp 17   Ht 5\' 7"  (1.702 m)   Wt 134 lb 14.7 oz (61.2 kg)   SpO2 99%   BMI 21.13 kg/m   Visual Acuity Right Eye Distance:   Left Eye Distance:   Bilateral Distance:    Right Eye Near:   Left Eye Near:    Bilateral Near:     Physical Exam Vitals and nursing note reviewed.  Constitutional:      General: He is not in acute distress.    Appearance: He is well-developed. He is not ill-appearing.  HENT:     Head: Normocephalic and atraumatic.     Mouth/Throat:     Mouth: Mucous membranes are moist.  Eyes:     Extraocular Movements: Extraocular movements intact.      Conjunctiva/sclera: Conjunctivae normal.     Pupils: Pupils are equal, round, and reactive to light.  Cardiovascular:     Rate and Rhythm: Normal rate and regular rhythm.     Heart sounds: Normal heart sounds.  Pulmonary:     Effort: Pulmonary effort is normal. No respiratory distress.     Breath sounds: Normal breath sounds. No wheezing or rhonchi.  Abdominal:     Palpations: Abdomen is soft.     Tenderness: There is no abdominal tenderness.  Musculoskeletal:     Cervical back: Neck supple.     Right lower leg: No edema.     Left lower leg: No edema.  Skin:    General: Skin is warm and dry.     Findings: No rash.  Neurological:     General: No focal deficit present.     Mental Status: He is alert and oriented to person, place, and time.     Sensory: No sensory deficit.     Motor: No weakness.     Coordination: Coordination normal.     Gait: Gait normal.  Psychiatric:        Mood and Affect: Mood normal.        Behavior: Behavior normal.      UC Treatments / Results  Labs (all labs ordered are listed, but only abnormal results are displayed) Labs Reviewed - No data to display  EKG   Radiology No results found.  Procedures Procedures (including critical care time)  Medications Ordered in UC Medications - No data to display  Initial Impression / Assessment and Plan / UC Course  I have reviewed the triage vital signs and the nursing notes.  Pertinent labs & imaging results that were available during my care of the patient were reviewed by me and considered in my medical decision making (see chart for details).   Dizziness, acute non-intractable headache.  Patient is well-appearing and his exam is reassuring.  Education provided on dizziness and headaches.  Instructed patient to schedule a follow-up appointment with his PCP as soon as possible.  Instructed him to go to the ED if he has acute worsening symptoms.  Patient agrees to plan of care.   Final Clinical  Impressions(s) / UC Diagnoses   Final diagnoses:  Dizziness  Acute nonintractable headache, unspecified headache type     Discharge Instructions  Schedule an appointment with your primary care provider soon as possible.    Go to the emergency department if you have acute worsening symptoms.        ED Prescriptions    None     PDMP not reviewed this encounter.   Mickie Bail, NP 09/25/20 1308

## 2020-09-25 NOTE — Discharge Instructions (Signed)
Schedule an appointment with your primary care provider soon as possible.    Go to the emergency department if you have acute worsening symptoms.

## 2020-12-28 ENCOUNTER — Ambulatory Visit
Admission: EM | Admit: 2020-12-28 | Discharge: 2020-12-28 | Disposition: A | Discharge status: Home / Self Care | Payer: Medicaid Other | Attending: Sports Medicine | Admitting: Sports Medicine

## 2020-12-28 ENCOUNTER — Other Ambulatory Visit: Payer: Self-pay

## 2020-12-28 DIAGNOSIS — Z87891 Personal history of nicotine dependence: Secondary | ICD-10-CM | POA: Diagnosis not present

## 2020-12-28 DIAGNOSIS — Z79899 Other long term (current) drug therapy: Secondary | ICD-10-CM | POA: Insufficient documentation

## 2020-12-28 DIAGNOSIS — Z8249 Family history of ischemic heart disease and other diseases of the circulatory system: Secondary | ICD-10-CM | POA: Diagnosis not present

## 2020-12-28 DIAGNOSIS — R0989 Other specified symptoms and signs involving the circulatory and respiratory systems: Secondary | ICD-10-CM | POA: Insufficient documentation

## 2020-12-28 DIAGNOSIS — Z881 Allergy status to other antibiotic agents status: Secondary | ICD-10-CM | POA: Insufficient documentation

## 2020-12-28 DIAGNOSIS — R110 Nausea: Secondary | ICD-10-CM | POA: Insufficient documentation

## 2020-12-28 DIAGNOSIS — I10 Essential (primary) hypertension: Secondary | ICD-10-CM | POA: Diagnosis not present

## 2020-12-28 DIAGNOSIS — Z20822 Contact with and (suspected) exposure to covid-19: Secondary | ICD-10-CM | POA: Insufficient documentation

## 2020-12-28 DIAGNOSIS — J111 Influenza due to unidentified influenza virus with other respiratory manifestations: Secondary | ICD-10-CM

## 2020-12-28 MED ORDER — LISINOPRIL 10 MG PO TABS: 10.0000 mg | ORAL_TABLET | Freq: Every day | ORAL | 3 refills | Status: AC

## 2020-12-28 MED ORDER — ACETAMINOPHEN 500 MG PO TABS
1000.0000 mg | ORAL_TABLET | Freq: Once | ORAL | Status: AC
  Administered 2020-12-28: 1000 mg via ORAL

## 2020-12-28 NOTE — Discharge Instructions (Addendum)
-  Covid test could take 12-24 hours return.  Quarantine yourself until those tests are resulted.  If it is a positive test he will be called.  Results can also be obtained through MyChart. -Again, will quarantine until results are completed.  If positive, will need to be then quarantine, isolate yourself for the recommended days, currently 5 days plus until symptom-free, including fever. -Push fluids -Over-the-counter medications for symptom management, including ibuprofen and Tylenol for fever. -Resume taking lisinopril 10 mg daily. Follow up with PCP.

## 2020-12-28 NOTE — ED Triage Notes (Signed)
Pt reports having cough, fever and sore throat x2 days. Also reports having back pain.

## 2020-12-28 NOTE — ED Provider Notes (Addendum)
[facility]    CSN: [id number] Arrival date & time: 12/28/20  1233      History   Chief Complaint Chief Complaint  Patient presents with  . Cough    HPI Darren Jones is a 20 y.o. male.   Patient is a 20 year old male who presents with chief complaint of runny nose, headache, fever, nausea, body aches, chest pressure that initially started Wednesday night.  Patient states symptoms were worse yesterday morning, Thursday.  Patient states he has not been vaccinated for Covid and denies any known sick contacts.  He does report being out with friends and eating establishment prior to the initial symptoms starting.  He has allergies to any medicines.  He did take some Motrin which did help with his fever.  Patient also reports history of hypertension but has not been on any medications recently.     Past Medical History:  Diagnosis Date  . Heart murmur   . Hypertension     There are no problems to display for this patient.   Past Surgical History:  Procedure Laterality Date  . NO PAST SURGERIES         Home Medications    Prior to Admission medications   Medication Sig Start Date End Date Taking? Authorizing Provider  lisinopril (ZESTRIL) 10 MG tablet Take 1 tablet (10 mg total) by mouth daily. 12/28/20  Yes Candis Schatz, PA-C  mupirocin nasal ointment (BACTROBAN) 2 % Apply in each nostril daily and apply to wound 3 times a day. 08/09/20   Becky Augusta, NP  albuterol (VENTOLIN HFA) 108 (90 Base) MCG/ACT inhaler Inhale 2 puffs into the lungs every 4 (four) hours as needed for wheezing or shortness of breath. 07/16/20 08/09/20  Mickie Bail, NP    Family History Family History  Problem Relation Age of Onset  . Healthy Mother   . Hypertension Father   . Diabetes Father     Social History Social History   Tobacco Use  . Smoking status: Former Smoker    Packs/day: 0.50    Types: Cigarettes    Quit date: 01/25/2020    Years since quitting: 0.9  .  Smokeless tobacco: Never Used  Vaping Use  . Vaping Use: Never used  Substance Use Topics  . Alcohol use: Never  . Drug use: Yes    Types: Marijuana     Allergies   Doxycycline   Review of Systems Review of Systems as noted above in HPI.  Other systems reviewed and found to be negative   Physical Exam Triage Vital Signs ED Triage Vitals  Enc Vitals Group     BP 12/28/20 1238 (!) 140/91     Pulse Rate 12/28/20 1238 98     Resp 12/28/20 1238 16     Temp 12/28/20 1238 (!) 100.9 F (38.3 C)     Temp Source 12/28/20 1238 Oral     SpO2 12/28/20 1238 100 %     Weight 12/28/20 1239 128 lb (58.1 kg)     Height 12/28/20 1239 5\' 7"  (1.702 m)     Head Circumference --      Peak Flow --      Pain Score 12/28/20 1239 3     Pain Loc --      Pain Edu? --      Excl. in GC? --    No data found.  Updated Vital Signs BP (!) 140/91   Pulse 98   Temp (!) 100.9 F (  38.3 C) (Oral)   Resp 16   Ht 5\' 7"  (1.702 m)   Wt 128 lb (58.1 kg)   SpO2 100%   BMI 20.05 kg/m    Physical Exam Constitutional:      Appearance: Normal appearance.  HENT:     Right Ear: Ear canal normal.     Left Ear: Ear canal normal.     Ears:     Comments: TMs obscured by cerumen bilaterally     Nose: Nose normal. No rhinorrhea.     Right Sinus: No maxillary sinus tenderness or frontal sinus tenderness.     Left Sinus: No maxillary sinus tenderness or frontal sinus tenderness.     Mouth/Throat:     Mouth: Mucous membranes are moist.     Comments: Clear post nasal drainage Cardiovascular:     Rate and Rhythm: Normal rate and regular rhythm.     Pulses: Normal pulses.  Pulmonary:     Effort: Pulmonary effort is normal. No respiratory distress.     Breath sounds: Normal breath sounds. No wheezing or rhonchi.  Abdominal:     General: Abdomen is flat.     Palpations: Abdomen is soft.  Musculoskeletal:     Cervical back: Normal range of motion and neck supple.  Lymphadenopathy:     Cervical: No  cervical adenopathy.  Skin:    General: Skin is dry.     Comments: Hot to touch  Neurological:     Mental Status: He is alert.      UC Treatments / Results  Labs (all labs ordered are listed, but only abnormal results are displayed) Labs Reviewed  SARS CORONAVIRUS 2 (TAT 6-24 HRS)    EKG   Radiology No results found.  Procedures Procedures (including critical care time)  Medications Ordered in UC Medications  acetaminophen (TYLENOL) tablet 1,000 mg (1,000 mg Oral Given 12/28/20 1302)    Initial Impression / Assessment and Plan / UC Course  I have reviewed the triage vital signs and the nursing notes.  Pertinent labs & imaging results that were available during my care of the patient were reviewed by me and considered in my medical decision making (see chart for details).    Patient is unvaccinated for Covid.  He denies any known sick contacts does present with symptoms of viral illness, including fever, headaches, nausea, chest pressure, and body aches.  Fever 100.9.  Give him a dose of Tylenol, 1 g.  Send Covid test.  Have patient follow-up isolation/quarantine pending test results. Also refill patient's prescription for lisinopril 10 mg daily and will have him follow-up with his primary care physician.  Final Clinical Impressions(s) / UC Diagnoses   Final diagnoses:  Influenza-like illness  Primary hypertension     Discharge Instructions     -Covid test could take 12-24 hours return.  Quarantine yourself until those tests are resulted.  If it is a positive test he will be called.  Results can also be obtained through MyChart. -Again, will quarantine until results are completed.  If positive, will need to be then quarantine, isolate yourself for the recommended days, currently 5 days plus until symptom-free, including fever. -Push fluids -Over-the-counter medications for symptom management, including ibuprofen and Tylenol for fever. -Resume taking lisinopril 10  mg daily. Follow up with PCP.    ED Prescriptions    Medication Sig Dispense Auth. Provider   lisinopril (ZESTRIL) 10 MG tablet Take 1 tablet (10 mg total) by mouth daily. 30 tablet 02/25/21  D, PA-C     PDMP not reviewed this encounter.   Candis Schatz, PA-C 12/28/20 1305    Candis Schatz, PA-C 12/28/20 1312

## 2020-12-29 LAB — SARS CORONAVIRUS 2 (TAT 6-24 HRS): SARS Coronavirus 2: NEGATIVE

## 2021-01-24 ENCOUNTER — Emergency Department
Admission: EM | Admit: 2021-01-24 | Discharge: 2021-01-24 | Disposition: A | Discharge status: Home / Self Care | Payer: Medicaid Other | Attending: Emergency Medicine | Admitting: Emergency Medicine

## 2021-01-24 ENCOUNTER — Telehealth: Payer: Self-pay | Admitting: Emergency Medicine

## 2021-01-24 ENCOUNTER — Other Ambulatory Visit: Payer: Self-pay

## 2021-01-24 ENCOUNTER — Ambulatory Visit
Admission: EM | Admit: 2021-01-24 | Discharge: 2021-01-24 | Disposition: A | Discharge status: Home / Self Care | Payer: Medicaid Other | Attending: Sports Medicine | Admitting: Sports Medicine

## 2021-01-24 DIAGNOSIS — L02416 Cutaneous abscess of left lower limb: Secondary | ICD-10-CM | POA: Diagnosis present

## 2021-01-24 DIAGNOSIS — Z5321 Procedure and treatment not carried out due to patient leaving prior to being seen by health care provider: Secondary | ICD-10-CM | POA: Insufficient documentation

## 2021-01-24 DIAGNOSIS — L02214 Cutaneous abscess of groin: Secondary | ICD-10-CM

## 2021-01-24 DIAGNOSIS — R1032 Left lower quadrant pain: Secondary | ICD-10-CM | POA: Diagnosis not present

## 2021-01-24 MED ORDER — SULFAMETHOXAZOLE-TRIMETHOPRIM 800-160 MG PO TABS: 1.0000 | ORAL_TABLET | Freq: Two times a day (BID) | ORAL | 0 refills | Status: AC

## 2021-01-24 NOTE — ED Triage Notes (Signed)
Pt states he was seen at UC earlier today for an abscess on his left inner thigh. Pt was referred to surgeon who he made an apt with for tomorrow morning but was instructed by surgeon to come into ED to see how big and deep abscess is,.

## 2021-01-24 NOTE — Discharge Instructions (Addendum)
I have recommended he return to [facility].  The clinical staff called over and he will see Dr Aleen Campi tomorrow to arrive at 8:50 AM at [address]. We will empirically start him on Bactrim double strength. Gave him educational handouts.  I want him to use warm compresses and hopefully thin out that abscess to make it easier for the general surgeon tomorrow. Over-the-counter meds as needed for fever or discomfort.  Follow-up here as needed.

## 2021-01-24 NOTE — ED Triage Notes (Signed)
Patient states that he is here for an abscess on his left inner thigh x 3 days. Area has drained some but not fully. Patient states that he has issue with reoccurring abscesses.

## 2021-01-24 NOTE — Telephone Encounter (Signed)
Contacted patient at the request of Dr. Aleen Campi to advise patient to be seen in the ER tonight for his thigh abscess. Patient has appointment scheduled tomorrow at 9am with Dr. Aleen Campi but was advised to go to [facility]. Patient stated he would try and go to the ER tonight but he had his children and wasn't sure if he would be able to go tonight. Relayed information to Dr. Zachery Dauer who saw the patient at [facility].

## 2021-01-24 NOTE — ED Provider Notes (Signed)
[facility]    CSN: [id number] Arrival date & time: 01/24/21  1356      History   Chief Complaint Chief Complaint  Patient presents with  . Abscess    Left thigh      HPI Darren Jones is a 20 y.o. male.   Patient is a 20 year old male who presents for evaluation of a recurrence of another abscess.  Patient reports has had multiple abscesses.  He says that he has had a lot of pain and abscess formation is worse in the last 3 days.  On further history it appears as though its been there and progressing for the last few months.  He has been referred to general surgery for another abscess that was drained back in June 2021.  He reports that he feels warm although no documented fevers.  No nausea vomiting diarrhea.  No discharge from the penis.  He has had scrotal abscesses and it does not involve the scrotum this time.  Its in the upper thigh and into the groin area.  Extends from the anterior aspect of the left groin to the posterior aspect of the left groin.     Past Medical History:  Diagnosis Date  . Heart murmur   . Hypertension     There are no problems to display for this patient.   Past Surgical History:  Procedure Laterality Date  . NO PAST SURGERIES         Home Medications    Prior to Admission medications   Medication Sig Start Date End Date Taking? Authorizing Provider  lisinopril (ZESTRIL) 10 MG tablet Take 1 tablet (10 mg total) by mouth daily. 12/28/20  Yes Candis Schatz, PA-C  sulfamethoxazole-trimethoprim (BACTRIM DS) 800-160 MG tablet Take 1 tablet by mouth 2 (two) times daily for 10 days. 01/24/21 02/03/21 Yes Delton See, MD  mupirocin nasal ointment (BACTROBAN) 2 % Apply in each nostril daily and apply to wound 3 times a day. 08/09/20   Becky Augusta, NP  albuterol (VENTOLIN HFA) 108 (90 Base) MCG/ACT inhaler Inhale 2 puffs into the lungs every 4 (four) hours as needed for wheezing or shortness of breath. 07/16/20 08/09/20  Mickie Bail, NP    Family History Family History  Problem Relation Age of Onset  . Healthy Mother   . Hypertension Father   . Diabetes Father     Social History Social History   Tobacco Use  . Smoking status: Former Smoker    Packs/day: 0.50    Types: Cigarettes    Quit date: 01/25/2020    Years since quitting: 1.0  . Smokeless tobacco: Never Used  Vaping Use  . Vaping Use: Never used  Substance Use Topics  . Alcohol use: Never  . Drug use: Yes    Types: Marijuana     Allergies   Doxycycline   Review of Systems Review of Systems  Constitutional: Positive for activity change, diaphoresis and fever. Negative for appetite change, chills and fatigue.  HENT: Negative.   Eyes: Negative.   Respiratory: Negative.   Cardiovascular: Negative.   Gastrointestinal: Negative.   Genitourinary: Negative for decreased urine volume, dysuria, flank pain, frequency, genital sores, hematuria, penile discharge, penile pain, penile swelling, scrotal swelling, testicular pain and urgency.  Skin:       Positive for a large abscess in the groin area.  Neurological: Negative.   All other systems reviewed and are negative.    Physical Exam Triage Vital Signs ED Triage Vitals  Enc Vitals Group     BP 01/24/21 1420 (!) 143/82     Pulse Rate 01/24/21 1420 96     Resp 01/24/21 1420 18     Temp 01/24/21 1420 99.7 F (37.6 C)     Temp Source 01/24/21 1420 Oral     SpO2 01/24/21 1420 98 %     Weight 01/24/21 1417 130 lb (59 kg)     Height 01/24/21 1417 5\' 7"  (1.702 m)     Head Circumference --      Peak Flow --      Pain Score 01/24/21 1417 7     Pain Loc --      Pain Edu? --      Excl. in GC? --    No data found.  Updated Vital Signs BP (!) 143/82 (BP Location: Left Arm)   Pulse 96   Temp 99.7 F (37.6 C) (Oral)   Resp 18   Ht 5\' 7"  (1.702 m)   Wt 59 kg   SpO2 98%   BMI 20.36 kg/m   Visual Acuity Right Eye Distance:   Left Eye Distance:   Bilateral Distance:    Right Eye  Near:   Left Eye Near:    Bilateral Near:     Physical Exam Vitals reviewed.  Constitutional:      General: He is not in acute distress.    Appearance: Normal appearance. He is not ill-appearing, toxic-appearing or diaphoretic.  HENT:     Head: Normocephalic and atraumatic.  Cardiovascular:     Rate and Rhythm: Normal rate and regular rhythm.     Pulses: Normal pulses.     Heart sounds: Normal heart sounds. No murmur heard. No friction rub. No gallop.   Pulmonary:     Effort: Pulmonary effort is normal.     Breath sounds: Normal breath sounds.  Skin:    Capillary Refill: Capillary refill takes less than 2 seconds.     Comments: Patient has some erythema and warmth in the left groin area.  There is a hard indurated abscess without fluctuance extending from the anterior aspect of the groin posteriorly but does not involve the anus.  He is extremely tender to palpation.  It is not actively draining at the present time.  There is no evidence of any phlebitis.  There is some lymphadenopathy appreciated in the groin.  Neurological:     General: No focal deficit present.     Mental Status: He is alert and oriented to person, place, and time.      UC Treatments / Results  Labs (all labs ordered are listed, but only abnormal results are displayed) Labs Reviewed - No data to display  EKG   Radiology No results found.  Procedures Procedures (including critical care time)  Medications Ordered in UC Medications - No data to display  Initial Impression / Assessment and Plan / UC Course  I have reviewed the triage vital signs and the nursing notes.  Pertinent labs & imaging results that were available during my care of the patient were reviewed by me and considered in my medical decision making (see chart for details).   Clinical impression: Fairly large indurated hard abscess that is very tender to palpation in the left groin area.  Treatment plan: 1.  The findings and  treatment plan were discussed in detail with the patient.  Patient was in agreement. 2.  I have recommended he return to [facility].  The clinical staff  called over and he will see the doctor this Cherene Altes tomorrow to arrive at 8:50 AM at [address]. 3. We will empirically start him on Bactrim double strength. 4.  Gave him educational handouts.  I want him to use warm compresses and hopefully thin out that abscess to make it easier for the general surgeon tomorrow. 5.  Over-the-counter meds as needed for fever or discomfort. 6.  Transfer care at this time. 7.  Follow-up here as needed.    Final Clinical Impressions(s) / UC Diagnoses   Final diagnoses:  Abscess of left groin  Severe left groin pain     Discharge Instructions     I have recommended he return to [facility].  The clinical staff called over and he will see Dr Aleen Campi tomorrow to arrive at 8:50 AM at [address]. We will empirically start him on Bactrim double strength. Gave him educational handouts.  I want him to use warm compresses and hopefully thin out that abscess to make it easier for the general surgeon tomorrow. Over-the-counter meds as needed for fever or discomfort.  Follow-up here as needed.    ED Prescriptions    Medication Sig Dispense Auth. Provider   sulfamethoxazole-trimethoprim (BACTRIM DS) 800-160 MG tablet Take 1 tablet by mouth 2 (two) times daily for 10 days. 20 tablet Delton See, MD     PDMP not reviewed this encounter.   Delton See, MD 01/24/21 1535

## 2021-01-24 NOTE — ED Notes (Addendum)
Upon this RN arrival to ED15A, pt stated "Can I go home? I am ready to go home" This RN told pt he has the right to leave, though this RN and EDP Scotty Court do not recommend he leaves until at least having his medical concerns examined. Pt understood, was made aware of risks, and pt stated he still wanted to sign AMA form. PT stated "Yeah, I'll just check with the surgeon in the morning" Pt ambulatory to exit, NAD noted, RR even and unlabored at this time.   MD Scotty Court made aware at this time. Pt LWBS, AMA form signed at this time.

## 2021-01-25 ENCOUNTER — Ambulatory Visit (INDEPENDENT_AMBULATORY_CARE_PROVIDER_SITE_OTHER): Payer: Medicaid Other | Admitting: Surgery

## 2021-01-25 ENCOUNTER — Encounter: Payer: Self-pay | Admitting: Surgery

## 2021-01-25 VITALS — BP 167/87 | HR 96 | Temp 98.0°F | Ht 67.0 in | Wt 123.4 lb

## 2021-01-25 DIAGNOSIS — L02214 Cutaneous abscess of groin: Secondary | ICD-10-CM | POA: Diagnosis not present

## 2021-01-25 NOTE — Progress Notes (Signed)
01/25/2021  History of Present Illness: Darren Jones is a 20 y.o. male with a history of prior left groin/upper thigh abscess, s/p I&D with Dr. Claudine Mouton on 04/26/20.  He presented to Urgent Care yesterday with concerns for 3 day history of worsening abscess just next the prior I&D site.  It was described as "a hard indurated abscess without fluctuance extending from the anterior aspect of the groin posteriorly but does not involve the anus"  No I&D was done and was referred to [place] for evaluation.  He was given a prescription for Bactrim.  He was recommended to go to the ER at my request as the description seemed to be of a large abscess that may need formal I&D in the OR.  He went to the ER but left AMA due to prolonged wait time.    This morning, the patient reports that he noticed some drainage when he was touching the area.  He has not yet started the antibiotic as he did not have a chance to pick it up.  Past Medical History: Past Medical History:  Diagnosis Date  . Heart murmur   . Hypertension      Past Surgical History: Past Surgical History:  Procedure Laterality Date  . INCISION AND DRAINAGE ABSCESS Left 04/26/2020    Home Medications: Prior to Admission medications   Medication Sig Start Date End Date Taking? Authorizing Provider  lisinopril (ZESTRIL) 10 MG tablet Take 1 tablet (10 mg total) by mouth daily. 12/28/20  Yes Candis Schatz, PA-C  mupirocin nasal ointment (BACTROBAN) 2 % Apply in each nostril daily and apply to wound 3 times a day. 08/09/20  Yes Becky Augusta, NP  sulfamethoxazole-trimethoprim (BACTRIM DS) 800-160 MG tablet Take 1 tablet by mouth 2 (two) times daily for 10 days. 01/24/21 02/03/21 Yes Delton See, MD  albuterol (VENTOLIN HFA) 108 (90 Base) MCG/ACT inhaler Inhale 2 puffs into the lungs every 4 (four) hours as needed for wheezing or shortness of breath. 07/16/20 08/09/20  Mickie Bail, NP    Allergies: Allergies  Allergen Reactions  . Doxycycline      "nausea and lightheadedness"     Review of Systems: Review of Systems  Constitutional: Negative for chills and fever.  Respiratory: Negative for shortness of breath.   Cardiovascular: Negative for chest pain.  Gastrointestinal: Negative for abdominal pain, nausea and vomiting.  Skin:       Left inner upper thigh abscess    Physical Exam BP (!) 167/87   Pulse 96   Temp 98 F (36.7 C)   Ht 5\' 7"  (1.702 m)   Wt 123 lb 6.4 oz (56 kg)   SpO2 99%   BMI 19.33 kg/m  CONSTITUTIONAL: No acute distress HEENT:  Normocephalic, atraumatic, extraocular motion intact. RESPIRATORY:  Normal respiratory effort without pathologic use of accessory muscles. CARDIOVASCULAR:  Regular rhythm and rate. GI: The abdomen is soft, non-distended, non-tender.  SKIN:  The patient has a 3 x 3 cm area in the upper inner thigh, next to the scrotum, with palpable fluctuance, and some surrounding induration extending about 2 cm anteriorly and posteriorly.  No active drainage. NEUROLOGIC:  Motor and sensation is grossly normal.  Cranial nerves are grossly intact. PSYCH:  Alert and oriented to person, place and time. Affect is normal.   Assessment and Plan: This is a 20 y.o. male with a new left upper inner thigh abscess.  --Discussed with the patient that he has a new abscess and there's an area of  fluctuance to allow for I&D.  Discussed risks of bleeding, infection, and injury to surrounding structures.  He's willing to proceed.   Procedure Date:  01/25/2021  Pre-operative Diagnosis:  Left upper inner thigh abscess  Post-operative Diagnosis:  Left upper inner thigh abscess  Procedure:  Incision and Drainage of left upper inner thigh abscess  Surgeon:  Howie Ill, MD  Assistant:  Drucilla Chalet, PA-S  Anesthesia:  5 ml of 1% lidocaine with epi  Estimated Blood Loss:  2 ml  Specimens:  Culture swab  Complications:  None  Indications for Procedure:  This is a 20 y.o. male with diagnosis of a left  upper inner thigh abscess, requiring drainage procedure.  The risks of bleeding, abscess or infection, injury to surrounding structures, and need for further procedures were all discussed with the patient and was willing to proceed.  Description of Procedure: The patient was correctly identified at bedside.  Appropriate time-outs were performed prior to procedure.  The patient's left inner upper thigh was prepped and draped in usual sterile fashion.  Local anesthetic was infused intradermally.  A cruciate 1.5 cm incision was made over the abscess, revealing purulent fluid.  This fluid was swabbed for culture and sent to micro.  Small Kelly forceps were used to dissect around the abscess tissue to open any remaining pockets of purulent fluid.  After drainage was completed, the cavity was irrigated and cleaned.  The wound was packed with 1/2 inch iodoform gauze and covered with dry gauze and tape.  The patient tolerated the procedure well and all sharps were appropriately disposed of at the end of the case.  --Patient will pick up his antibiotic prescription this morning --Tylenol and Ibuprofen for pain control --Follow up next Wednesday for wound check   Howie Ill, MD [facility]

## 2021-01-25 NOTE — Patient Instructions (Addendum)
Start your antibiotics today and complete the full course.   You may take Ibuprofen 600-800 mg three times a day. Or you may take 2 extra strength Tylenol three times a day.   Change your packing and dressing once a day. If the top dressing becomes saturated you may change this more often.  You may shower, remove your packing first and let the warm soapy water run over the area, rinse well and pat dry and repack and redress the area.  You will pack less and less in the area each day.   Follow up here on Wednesday.  We will call you if the culture comes back and we need to change your antibiotics.

## 2021-01-28 ENCOUNTER — Other Ambulatory Visit: Payer: Self-pay | Admitting: Surgery

## 2021-01-28 MED ORDER — AMOXICILLIN-POT CLAVULANATE 875-125 MG PO TABS: 1.0000 | ORAL_TABLET | Freq: Two times a day (BID) | ORAL | 0 refills | Status: AC

## 2021-01-28 NOTE — Progress Notes (Signed)
01/28/21  Reviewed micro results from I&D done on 01/25/21 of left upper inner thigh abscess.  Bacteria growing is group B strep.  He's currently taking Bactrim, which is not the right antibiotic for this bacteria.  Have contacted the patient about the results and have sent a new prescription for Augmentin 7 day course to his pharmacy.  He's aware that he should start taking Augmentin today and stop taking Bactrim.  He has follow up appointment with me on 01/30/21.  Henrene Dodge, MD

## 2021-01-29 LAB — ANAEROBIC AND AEROBIC CULTURE

## 2021-01-30 ENCOUNTER — Encounter: Payer: Medicaid Other | Admitting: Surgery

## 2021-02-11 ENCOUNTER — Encounter: Payer: Medicaid Other | Admitting: Surgery

## 2021-04-03 ENCOUNTER — Emergency Department
Admission: EM | Admit: 2021-04-03 | Discharge: 2021-04-03 | Disposition: A | Discharge status: Home / Self Care | Payer: Medicaid Other | Attending: Emergency Medicine | Admitting: Emergency Medicine

## 2021-04-03 ENCOUNTER — Other Ambulatory Visit: Payer: Self-pay

## 2021-04-03 ENCOUNTER — Emergency Department: Payer: Medicaid Other

## 2021-04-03 ENCOUNTER — Encounter: Payer: Self-pay | Admitting: Emergency Medicine

## 2021-04-03 DIAGNOSIS — R1084 Generalized abdominal pain: Secondary | ICD-10-CM | POA: Diagnosis not present

## 2021-04-03 DIAGNOSIS — R109 Unspecified abdominal pain: Secondary | ICD-10-CM | POA: Diagnosis present

## 2021-04-03 DIAGNOSIS — Z87891 Personal history of nicotine dependence: Secondary | ICD-10-CM | POA: Diagnosis not present

## 2021-04-03 DIAGNOSIS — Z79899 Other long term (current) drug therapy: Secondary | ICD-10-CM | POA: Diagnosis not present

## 2021-04-03 DIAGNOSIS — I10 Essential (primary) hypertension: Secondary | ICD-10-CM | POA: Diagnosis not present

## 2021-04-03 DIAGNOSIS — K3189 Other diseases of stomach and duodenum: Secondary | ICD-10-CM | POA: Diagnosis not present

## 2021-04-03 LAB — COMPREHENSIVE METABOLIC PANEL
ALT: 49 U/L — ABNORMAL HIGH (ref 0–44)
AST: 37 U/L (ref 15–41)
Albumin: 5 g/dL (ref 3.5–5.0)
Alkaline Phosphatase: 64 U/L (ref 38–126)
Anion gap: 11 (ref 5–15)
BUN: 13 mg/dL (ref 6–20)
CO2: 25 mmol/L (ref 22–32)
Calcium: 9.7 mg/dL (ref 8.9–10.3)
Chloride: 103 mmol/L (ref 98–111)
Creatinine, Ser: 0.86 mg/dL (ref 0.61–1.24)
GFR, Estimated: >60 mL/min (ref >60)
Glucose, Bld: 108 mg/dL — ABNORMAL HIGH (ref 70–99)
Potassium: 3.4 mmol/L — ABNORMAL LOW (ref 3.5–5.1)
Sodium: 139 mmol/L (ref 135–145)
Total Bilirubin: 0.7 mg/dL (ref 0.3–1.2)
Total Protein: 8.1 g/dL (ref 6.5–8.1)

## 2021-04-03 LAB — CBC
HCT: 46 % (ref 39.0–52.0)
Hemoglobin: 15.7 g/dL (ref 13.0–17.0)
MCH: 28 pg (ref 26.0–34.0)
MCHC: 34.1 g/dL (ref 30.0–36.0)
MCV: 82.1 fL (ref 80.0–100.0)
Platelets: 307 10*3/uL (ref 150–400)
RBC: 5.6 MIL/uL (ref 4.22–5.81)
RDW: 14.8 % (ref 11.5–15.5)
WBC: 6.6 10*3/uL (ref 4.0–10.5)
nRBC: 0 % (ref 0.0–0.2)

## 2021-04-03 LAB — LIPASE, BLOOD: Lipase: 24 U/L (ref 11–51)

## 2021-04-03 MED ORDER — DICYCLOMINE HCL 10 MG PO CAPS: 10.0000 mg | ORAL_CAPSULE | Freq: Four times a day (QID) | ORAL | 0 refills | Status: DC

## 2021-04-03 NOTE — Discharge Instructions (Signed)
Return to the ER if worsening Take the bentyl for abdominal cramping

## 2021-04-03 NOTE — ED Provider Notes (Signed)
[facility] Emergency Department Provider Note  ____________________________________________   Event Date/Time   First MD Initiated Contact with Patient 04/03/21 1200     (approximate)  I have reviewed the triage vital signs and the nursing notes.   HISTORY  Chief Complaint Abdominal Pain    HPI Darren Jones is a 20 y.o. male this emergency department complaining of abdominal pain and back pain for 3 days.  No known injury.  Patient had vomiting and diarrhea last week.  States his stools are still loose but not watery.  States he is just concerned that the pain and cramping are still there.  No fever or chills.  Denies pain of the abdomen while walking or jumping.    Past Medical History:  Diagnosis Date  . Heart murmur   . Hypertension     There are no problems to display for this patient.   Past Surgical History:  Procedure Laterality Date  . INCISION AND DRAINAGE ABSCESS Left 04/26/2020    Prior to Admission medications   Medication Sig Start Date End Date Taking? Authorizing Provider  dicyclomine (BENTYL) 10 MG capsule Take 1 capsule (10 mg total) by mouth 4 (four) times daily for 14 days. 04/03/21 04/17/21 Yes Raney Koeppen, Roselyn Bering, PA-C  lisinopril (ZESTRIL) 10 MG tablet Take 1 tablet (10 mg total) by mouth daily. 12/28/20   Candis Schatz, PA-C  mupirocin nasal ointment (BACTROBAN) 2 % Apply in each nostril daily and apply to wound 3 times a day. 08/09/20   Becky Augusta, NP  albuterol (VENTOLIN HFA) 108 (90 Base) MCG/ACT inhaler Inhale 2 puffs into the lungs every 4 (four) hours as needed for wheezing or shortness of breath. 07/16/20 08/09/20  Mickie Bail, NP    Allergies Doxycycline  Family History  Problem Relation Age of Onset  . Healthy Mother   . Hypertension Father   . Diabetes Father     Social History Social History   Tobacco Use  . Smoking status: Former Smoker    Packs/day: 0.50    Types: Cigarettes    Quit date:  01/25/2020    Years since quitting: 1.1  . Smokeless tobacco: Never Used  Vaping Use  . Vaping Use: Never used  Substance Use Topics  . Alcohol use: Never  . Drug use: Yes    Types: Marijuana    Review of Systems  Constitutional: No fever/chills Eyes: No visual changes. ENT: No sore throat. Respiratory: Denies cough Cardiovascular: Denies chest pain Gastrointestinal: Positive abdominal pain Genitourinary: Negative for dysuria. Musculoskeletal: Negative for back pain. Skin: Negative for rash. Psychiatric: no mood changes,     ____________________________________________   PHYSICAL EXAM:  VITAL SIGNS: ED Triage Vitals  Enc Vitals Group     BP 04/03/21 1157 (!) 157/107     Pulse Rate 04/03/21 1157 72     Resp 04/03/21 1157 18     Temp 04/03/21 1157 97.6 F (36.4 C)     Temp Source 04/03/21 1157 Oral     SpO2 04/03/21 1157 100 %     Weight 04/03/21 1153 125 lb (56.7 kg)     Height 04/03/21 1153 5\' 7"  (1.702 m)     Head Circumference --      Peak Flow --      Pain Score 04/03/21 1153 2     Pain Loc --      Pain Edu? --      Excl. in GC? --     Constitutional:  Alert and oriented. Well appearing and in no acute distress. Eyes: Conjunctivae are normal.  Head: Atraumatic. Nose: No congestion/rhinnorhea. Mouth/Throat: Mucous membranes are moist.   Neck:  supple no lymphadenopathy noted Cardiovascular: Normal rate, regular rhythm. Heart sounds are normal Respiratory: Normal respiratory effort.  No retractions, lungs c t a  Abd: soft minimally tender in the right lower quadrant upon deep palpation, bs normal all 4 quad, patient is able to jump up and down without reproducing pain GU: deferred Musculoskeletal: FROM all extremities, warm and well perfused Neurologic:  Normal speech and language.  Skin:  Skin is warm, dry and intact. No rash noted. Psychiatric: Mood and affect are normal. Speech and behavior are normal.  ____________________________________________    LABS (all labs ordered are listed, but only abnormal results are displayed)  Labs Reviewed  COMPREHENSIVE METABOLIC PANEL - Abnormal; Notable for the following components:      Result Value   Potassium 3.4 (*)    Glucose, Bld 108 (*)    ALT 49 (*)    All other components within normal limits  LIPASE, BLOOD  CBC   ____________________________________________   ____________________________________________  RADIOLOGY  CT abdomen/pelvis  ____________________________________________   PROCEDURES  Procedure(s) performed: No  Procedures    ____________________________________________   INITIAL IMPRESSION / ASSESSMENT AND PLAN / ED COURSE  Pertinent labs & imaging results that were available during my care of the patient were reviewed by me and considered in my medical decision making (see chart for details).   The patient is a 20 year old male presents with abdominal pain.  See HPI.  Physical exam shows patient to appear stable.  DDx: Acute gastroenteritis, acute appendicitis, kidney stone  CBC is normal, metabolic panel is normal, lipase is normal  Due to shortage of IV contrast, CT abdomen/pelvis without contrast was ordered.  CT was reviewed by me. Radiologist comments that he is unable to see the appendix at this time.  No other acute abnormalities are noted  I did discuss this with the patient.  Due to him being able to jump up and down without pain in the right lower quadrant, his WBC is normal, and he appears to feel well I do not feel that he has an acute appendicitis at this time.  However I did discuss with him the need to return if worsening.  He was given strict instructions to return if increasing right lower quadrant pain.  Explained to him at that time we will do a CT with IV contrast.  He is agreeable to this plan.  He was discharged in stable condition with a work note.  He was also given a prescription for Bentyl     Darren Jones was evaluated in  Emergency Department on 04/03/2021 for the symptoms described in the history of present illness. He was evaluated in the context of the global COVID-19 pandemic, which necessitated consideration that the patient might be at risk for infection with the SARS-CoV-2 virus that causes COVID-19. Institutional protocols and algorithms that pertain to the evaluation of patients at risk for COVID-19 are in a state of rapid change based on information released by regulatory bodies including the CDC and federal and state organizations. These policies and algorithms were followed during the patient's care in the ED.    As part of my medical decision making, I reviewed the following data within the electronic MEDICAL RECORD NUMBER Nursing notes reviewed and incorporated, Labs reviewed , Old chart reviewed, Radiograph reviewed , Notes from prior ED  visits and [place] Controlled Substance Database  ____________________________________________   FINAL CLINICAL IMPRESSION(S) / ED DIAGNOSES  Final diagnoses:  Generalized abdominal pain      NEW MEDICATIONS STARTED DURING THIS VISIT:  New Prescriptions   DICYCLOMINE (BENTYL) 10 MG CAPSULE    Take 1 capsule (10 mg total) by mouth 4 (four) times daily for 14 days.     Note:  This document was prepared using Dragon voice recognition software and may include unintentional dictation errors.    Faythe Ghee, PA-C 04/03/21 1329    Merwyn Katos, MD 04/03/21 1525

## 2021-04-03 NOTE — ED Triage Notes (Signed)
Pt comes into the ED via POV c/o abdominal and back pain x 3 days.  Pt denies any known injuries.  Pt denies any N/V/D.  Pt ambulatory to exam room at this time with even and unlabored respirations.

## 2021-07-16 ENCOUNTER — Other Ambulatory Visit: Payer: Self-pay

## 2021-07-16 ENCOUNTER — Encounter: Payer: Self-pay | Admitting: Emergency Medicine

## 2021-07-16 ENCOUNTER — Ambulatory Visit
Admission: EM | Admit: 2021-07-16 | Discharge: 2021-07-16 | Disposition: A | Discharge status: Home / Self Care | Payer: Medicaid Other | Attending: Emergency Medicine | Admitting: Emergency Medicine

## 2021-07-16 DIAGNOSIS — N492 Inflammatory disorders of scrotum: Secondary | ICD-10-CM | POA: Diagnosis not present

## 2021-07-16 DIAGNOSIS — I10 Essential (primary) hypertension: Secondary | ICD-10-CM | POA: Diagnosis not present

## 2021-07-16 MED ORDER — SULFAMETHOXAZOLE-TRIMETHOPRIM 800-160 MG PO TABS: 1.0000 | ORAL_TABLET | Freq: Two times a day (BID) | ORAL | 0 refills | Status: DC

## 2021-07-16 NOTE — ED Provider Notes (Signed)
Darren Jones    CSN: [id number] Arrival date & time: 07/16/21  1818      History   Chief Complaint Chief Complaint  Patient presents with   Abscess    HPI Darren Jones is a 20 y.o. male.  Patient presents with 4-day history of an abscess on his left scrotum.  The area is tender and firm; no open wounds or drainage.  He denies fever, chills, abdominal pain, dysuria, hematuria, penile discharge, or other symptoms.  No treatments attempted at home.  He is previously seen a Development worker, international aid for a different abscess.  His medical history includes frequent abscesses, hypertension, heart murmur.  Patient states he has not taken his blood pressure medication today.  The history is provided by the patient and medical records.   Past Medical History:  Diagnosis Date   Heart murmur    Hypertension     There are no problems to display for this patient.   Past Surgical History:  Procedure Laterality Date   INCISION AND DRAINAGE ABSCESS Left 04/26/2020       Home Medications    Prior to Admission medications   Medication Sig Start Date End Date Taking? Authorizing Provider  sulfamethoxazole-trimethoprim (BACTRIM DS) 800-160 MG tablet Take 1 tablet by mouth 2 (two) times daily for 7 days. 07/16/21 07/23/21 Yes Mickie Bail, NP  dicyclomine (BENTYL) 10 MG capsule Take 1 capsule (10 mg total) by mouth 4 (four) times daily for 14 days. 04/03/21 04/17/21  Fisher, Roselyn Bering, PA-C  lisinopril (ZESTRIL) 10 MG tablet Take 1 tablet (10 mg total) by mouth daily. 12/28/20   Candis Schatz, PA-C  mupirocin nasal ointment (BACTROBAN) 2 % Apply in each nostril daily and apply to wound 3 times a day. 08/09/20   Becky Augusta, NP  albuterol (VENTOLIN HFA) 108 (90 Base) MCG/ACT inhaler Inhale 2 puffs into the lungs every 4 (four) hours as needed for wheezing or shortness of breath. 07/16/20 08/09/20  Mickie Bail, NP    Family History Family History  Problem Relation Age of Onset   Healthy  Mother    Hypertension Father    Diabetes Father     Social History Social History   Tobacco Use   Smoking status: Former    Packs/day: 0.50    Types: Cigarettes    Quit date: 01/25/2020    Years since quitting: 1.4   Smokeless tobacco: Never  Vaping Use   Vaping Use: Never used  Substance Use Topics   Alcohol use: Never   Drug use: Yes    Types: Marijuana     Allergies   Doxycycline   Review of Systems Review of Systems  Constitutional:  Negative for chills and fever.  Respiratory:  Negative for cough and shortness of breath.   Cardiovascular:  Negative for chest pain and palpitations.  Gastrointestinal:  Negative for abdominal pain and vomiting.  Genitourinary:  Negative for dysuria, flank pain, hematuria and penile discharge.  Skin:  Negative for color change and wound.  All other systems reviewed and are negative.   Physical Exam Triage Vital Signs ED Triage Vitals  Enc Vitals Group     BP      Pulse      Resp      Temp      Temp src      SpO2      Weight      Height      Head Circumference  Peak Flow      Pain Score      Pain Loc      Pain Edu?      Excl. in GC?    No data found.  Updated Vital Signs BP (!) 158/89 (BP Location: Left Arm)   Pulse 84   Temp 99.2 F (37.3 C) (Oral)   Resp 18   SpO2 98%   Visual Acuity Right Eye Distance:   Left Eye Distance:   Bilateral Distance:    Right Eye Near:   Left Eye Near:    Bilateral Near:     Physical Exam Vitals and nursing note reviewed.  Constitutional:      General: He is not in acute distress.    Appearance: He is well-developed.  HENT:     Head: Normocephalic and atraumatic.     Mouth/Throat:     Mouth: Mucous membranes are moist.  Eyes:     Conjunctiva/sclera: Conjunctivae normal.  Cardiovascular:     Rate and Rhythm: Normal rate and regular rhythm.     Heart sounds: No murmur heard. Pulmonary:     Effort: Pulmonary effort is normal. No respiratory distress.     Breath  sounds: Normal breath sounds.  Abdominal:     Palpations: Abdomen is soft.     Tenderness: There is no abdominal tenderness. There is no guarding or rebound.  Genitourinary:    Comments: Firm, nonfluctuant, tender abscess on left scrotum.  No open wounds or drainage. Musculoskeletal:     Cervical back: Neck supple.  Skin:    General: Skin is warm and dry.  Neurological:     General: No focal deficit present.     Mental Status: He is alert and oriented to person, place, and time.  Psychiatric:        Mood and Affect: Mood normal.        Behavior: Behavior normal.     UC Treatments / Results  Labs (all labs ordered are listed, but only abnormal results are displayed) Labs Reviewed - No data to display  EKG   Radiology No results found.  Procedures Procedures (including critical care time)  Medications Ordered in UC Medications - No data to display  Initial Impression / Assessment and Plan / UC Course  I have reviewed the triage vital signs and the nursing notes.  Pertinent labs & imaging results that were available during my care of the patient were reviewed by me and considered in my medical decision making (see chart for details).  Abscess of scrotum.  Elevated blood pressure reading with known hypertension.  Treating with Bactrim DS.  Instructed patient to call the surgeons office tomorrow to schedule an appointment.  Education provided on abscesses.  Also discussed that his blood pressure is elevated today and needs to be rechecked by his PCP in 2 to 4 weeks.  Education provided on managing hypertension.  Patient agrees to plan of care.   Final Clinical Impressions(s) / UC Diagnoses   Final diagnoses:  Abscess of scrotum  Elevated blood pressure reading in office with diagnosis of hypertension     Discharge Instructions      Take the antibiotic as directed.  Schedule an appointment with a surgeon such as the one listed below tomorrow.  Your blood pressure  is elevated today at 158/89.  Please have this rechecked by your primary care provider in 2-4 weeks.          ED Prescriptions     Medication  Sig Dispense Auth. Provider   sulfamethoxazole-trimethoprim (BACTRIM DS) 800-160 MG tablet Take 1 tablet by mouth 2 (two) times daily for 7 days. 14 tablet Mickie Bail, NP      PDMP not reviewed this encounter.   Mickie Bail, NP 07/16/21 [phone]

## 2021-07-16 NOTE — ED Triage Notes (Signed)
Pt here with abscess in left groin x 4 days. States it's the size of a walnut and is painful and not draining. Pt is prone to this condition and has needed to see surgery in the past.

## 2021-07-16 NOTE — Discharge Instructions (Addendum)
Take the antibiotic as directed.  Schedule an appointment with a surgeon such as the one listed below tomorrow.  Your blood pressure is elevated today at 158/89.  Please have this rechecked by your primary care provider in 2-4 weeks.

## 2021-07-17 ENCOUNTER — Encounter: Payer: Self-pay | Admitting: Surgery

## 2021-07-17 ENCOUNTER — Ambulatory Visit (INDEPENDENT_AMBULATORY_CARE_PROVIDER_SITE_OTHER): Payer: Medicaid Other | Admitting: Surgery

## 2021-07-17 VITALS — BP 137/93 | HR 93 | Temp 98.9°F | Ht 67.0 in | Wt 121.0 lb

## 2021-07-17 DIAGNOSIS — N492 Inflammatory disorders of scrotum: Secondary | ICD-10-CM

## 2021-07-17 MED ORDER — AMOXICILLIN-POT CLAVULANATE 875-125 MG PO TABS: 1.0000 | ORAL_TABLET | Freq: Two times a day (BID) | ORAL | 0 refills | Status: AC

## 2021-07-17 NOTE — Progress Notes (Signed)
07/17/2021  History of Present Illness: Darren Jones is a 20 y.o. male s/p I&D of a left upper thigh abscess on 01/25/21.  He presented to the ED yesterday with a new left scrotal abscess.  He was started on Bactrim but he has not been able to pick it up yet.  Today, while he was showering, he started having spontaneous drainage from it and he continued squeezing the fluid out.  Denies any fevers, chills, chest pain, shortness of breath.  This is a different area compared to his prior I&D 5 months ago.  Past Medical History: Past Medical History:  Diagnosis Date   Heart murmur    Hypertension      Past Surgical History: Past Surgical History:  Procedure Laterality Date   INCISION AND DRAINAGE ABSCESS Left 04/26/2020    Home Medications: Prior to Admission medications   Medication Sig Start Date End Date Taking? Authorizing Provider  amoxicillin-clavulanate (AUGMENTIN) 875-125 MG tablet Take 1 tablet by mouth 2 (two) times daily for 7 days. 07/17/21 07/24/21 Yes Kelsha Older, Elita Quick, MD  lisinopril (ZESTRIL) 10 MG tablet Take 1 tablet (10 mg total) by mouth daily. 12/28/20  Yes Candis Schatz, PA-C  albuterol (VENTOLIN HFA) 108 (90 Base) MCG/ACT inhaler Inhale 2 puffs into the lungs every 4 (four) hours as needed for wheezing or shortness of breath. 07/16/20 08/09/20  Mickie Bail, NP    Allergies: Allergies  Allergen Reactions   Doxycycline     "nausea and lightheadedness"     Review of Systems: Review of Systems  Constitutional:  Negative for chills and fever.  Respiratory:  Negative for shortness of breath.   Cardiovascular:  Negative for chest pain.  Gastrointestinal:  Negative for abdominal pain, nausea and vomiting.  Skin:        Left scrotal abscess   Physical Exam BP (!) 137/93   Pulse 93   Temp 98.9 F (37.2 C)   Ht 5\' 7"  (1.702 m)   Wt 121 lb (54.9 kg)   SpO2 98%   BMI 18.95 kg/m  CONSTITUTIONAL: No acute distress HEENT:  Normocephalic, atraumatic, extraocular  motion intact. RESPIRATORY:  Lungs are clear, and breath sounds are equal bilaterally. Normal respiratory effort without pathologic use of accessory muscles. CARDIOVASCULAR: Heart is regular without murmurs, gallops, or rubs. GU:  There is a 2 x 1 cm abscess in the left scrotum, lateral portion.  No active drainage but there is palpable fluctuance, with surrounding induration. NEUROLOGIC:  Motor and sensation is grossly normal.  Cranial nerves are grossly intact. PSYCH:  Alert and oriented to person, place and time. Affect is normal.   Assessment and Plan: This is a 20 y.o. male with left scrotal abscess.  Discussed with him the need for I&D, reviewed risks of bleeding, infection, injury to surrounding structures, need for further procedures, and he's willing to proceed.   Procedure Date:  07/17/2021  Pre-operative Diagnosis:  Left scrotal abscess  Post-operative Diagnosis: Left scrotal abscess  Procedure:  Incision and Drainage of left scrotal abscess  Surgeon:  07/19/2021, MD  Anesthesia:  5 ml of 1% lidocaine with epi  Estimated Blood Loss:  4 ml  Specimens:  culture swab  Complications:  None  Indications for Procedure:  This is a 20 y.o. male with diagnosis of left scrotal abscess, requiring drainage procedure.  The risks of bleeding, abscess or infection, injury to surrounding structures, and need for further procedures were all discussed with the patient and was willing to  proceed.  Description of Procedure: The patient was correctly identified at bedside.  Appropriate time-outs were performed prior to procedure.  The patient's left scrotum was prepped and draped in usual sterile fashion.  Local anesthetic was infused intradermally.  A cruciate 1.5 cm incision was made over the abscess, revealing mild purulent fluid, mostly bloody.  This fluid was swabbed for culture and sent to micro.  Small Kelly forceps were used to dissect around the abscess tissue to open any  remaining pockets of purulent fluid.  After drainage was completed, the cavity was irrigated and cleaned.  The wound was dressed with 4x4 gauze and secured with tape.  The patient tolerated the procedure well and all sharps were appropriately disposed of at the end of the case.  --Based on his prior cultures, recommended changing antibiotics to Augmentin.  D/C bactrim for now. --Dry gauze dressing changes daily and as needed to keep the area clean and dry. --Tylenol and ibuprofen for pain. --Follow up in 10 days.    Face-to-face time spent with the patient and care providers was 25 minutes, with more than 50% of the time spent counseling, educating, and coordinating care of the patient.     Howie Ill, MD [facility]

## 2021-07-17 NOTE — Patient Instructions (Addendum)
You may shower. Try to keep the area open to it continues to drain and heal from the inside out.   You may need to keep a gauze/pad over the area to catch any further drainage.  We have sent you in a prescription for Augmentin to start. We will call you with the results of your culture.   Follow up in 10 days.

## 2021-07-22 ENCOUNTER — Encounter: Payer: Self-pay | Admitting: Emergency Medicine

## 2021-07-22 ENCOUNTER — Other Ambulatory Visit: Payer: Self-pay

## 2021-07-22 ENCOUNTER — Ambulatory Visit
Admission: EM | Admit: 2021-07-22 | Discharge: 2021-07-22 | Disposition: A | Discharge status: Home / Self Care | Payer: Medicaid Other | Attending: Emergency Medicine | Admitting: Emergency Medicine

## 2021-07-22 DIAGNOSIS — N492 Inflammatory disorders of scrotum: Secondary | ICD-10-CM | POA: Diagnosis not present

## 2021-07-22 MED ORDER — HYDROCODONE-ACETAMINOPHEN 5-325 MG PO TABS: 1.0000 | ORAL_TABLET | Freq: Three times a day (TID) | ORAL | 0 refills | Status: AC | PRN

## 2021-07-22 NOTE — ED Triage Notes (Signed)
Pt here post I&D for abscess on scrotum on the 24th. Today returns here with abnormal bleeding and excess pain starting yesterday. Has not followed up with surgery.

## 2021-07-22 NOTE — Discharge Instructions (Addendum)
Follow up with your surgeon today to schedule an appointment sooner to reevaluate  Take antibiotic with food as prescribed from your surgeon. Take pain medication (Norco) as prescribed Apply warm compresses to promote drainage. Keep covered until wound closes. Expect it to gradually improve over 7 - 10 days.  Watch for signs of worsening infection: increased redness, swelling, red streaking, fever, or worsening pain. If these symptoms are present, or if you have new or concerning symptoms, return to clinic immediately for a recheck.

## 2021-07-22 NOTE — ED Provider Notes (Signed)
Chief Complaint   Chief Complaint  Patient presents with   Abscess     Subjective, HPI  Darren Jones is a very pleasant 20 y.o. male who presents with left scrotal abscess for which he is reporting new discomfort and bleeding to the area after having the area drained by a surgeon.  Patient had an I&D completed on the scrotum on 8/24.  Patient reports that he has not followed up with his surgeon.  Patient does not report any fever, chills, abdominal pain or vomiting today.  History obtained from patient.   Patient's problem list, past medical and social history, medications, and allergies were reviewed by me and updated in Epic.    ROS  See HPI.  Objective   Vitals:   07/22/21 1001  BP: (!) 145/73  Pulse: (!) 59  Resp: 16  Temp: 98.1 F (36.7 C)  SpO2: 98%    Vital signs and nursing note reviewed.  General: Appears well-developed and well-nourished. No acute distress.  HEENT: Normocephalic, atraumatic, hearing grossly intact. EOMI, no drainage. No rhinorrhea. Moist mucous membranes.  Neck: Normal range of motion, neck is supple.  Cardiovascular: Normal rate.  Pulm/Chest: No respiratory distress.   Musculoskeletal: No joint deformity, normal range of motion.  Skin: Scrotal abscess noted to lateral superior aspect of the left scrotum with moderate TTP.  Area is indurated.  No drainage at this time.  Data  No results found for any visits on 07/22/21.  Assessment & Plan  1. Scrotal abscess - HYDROcodone-acetaminophen (NORCO/VICODIN) 5-325 MG tablet; Take 1 tablet by mouth every 8 (eight) hours as needed for up to 3 days.  Dispense: 9 tablet; Refill: 0  20 y.o. male presents with left scrotal abscess for which he is reporting new discomfort and bleeding to the area after having the area drained by a surgeon.  Patient had an I&D completed on the scrotum on 8/24.  Patient reports that he has not followed up with his surgeon.  Patient does not report any fever, chills,  abdominal pain or vomiting today.  Chart review completed.  Given symptoms along with pain level, will Rx Norco to the patient's preferred pharmacy.  On chart review, the patient is already prescribed Augmentin for which he reports that he is tolerating well.  Advised the patient to follow-up with his surgeon for further evaluation if his symptoms do not improve.  Warm compresses to the area will also help with discomfort.  Return as needed.  Stable on discharge.  Patient verbalized understanding and agreed with plan.  Work note provided.  Plan:   Discharge Instructions      Follow up with your surgeon today to schedule an appointment sooner to reevaluate  Take antibiotic with food as prescribed from your surgeon. Take pain medication (Norco) as prescribed Apply warm compresses to promote drainage. Keep covered until wound closes. Expect it to gradually improve over 7 - 10 days.  Watch for signs of worsening infection: increased redness, swelling, red streaking, fever, or worsening pain. If these symptoms are present, or if you have new or concerning symptoms, return to clinic immediately for a recheck.          Amalia Greenhouse, FNP 07/22/21 1053

## 2021-07-26 ENCOUNTER — Other Ambulatory Visit: Payer: Self-pay

## 2021-07-26 ENCOUNTER — Ambulatory Visit (INDEPENDENT_AMBULATORY_CARE_PROVIDER_SITE_OTHER): Payer: Medicaid Other | Admitting: Surgery

## 2021-07-26 ENCOUNTER — Encounter: Payer: Self-pay | Admitting: Surgery

## 2021-07-26 VITALS — BP 127/82 | HR 72 | Temp 98.4°F | Ht 67.0 in | Wt 119.0 lb

## 2021-07-26 DIAGNOSIS — Z09 Encounter for follow-up examination after completed treatment for conditions other than malignant neoplasm: Secondary | ICD-10-CM

## 2021-07-26 DIAGNOSIS — N492 Inflammatory disorders of scrotum: Secondary | ICD-10-CM

## 2021-07-26 NOTE — Patient Instructions (Signed)
Please complete all your antibiotics. If you have any concerns or questions, please feel free to call our office. Follow up as needed.

## 2021-07-26 NOTE — Progress Notes (Signed)
07/26/2021  HPI: Darren Jones is a 20 y.o. male s/p I&D of left scrotal abscess on 07/17/2021.  He was given a course of Augmentin from the office.  He presented to urgent care on 07/22/2021 per reports that now his pain is much better.  He reports that the wound is healing well and denies any further swelling or hardness.  Vital signs: BP 127/82   Pulse 72   Temp 98.4 F (36.9 C)   Ht 5\' 7"  (1.702 m)   Wt 119 lb (54 kg)   SpO2 98%   BMI 18.64 kg/m    Physical Exam: Constitutional: No acute distress GU: Left scrotal I&D incision site is healing well and is very small now measuring about 3 mm in size.  There is no further surrounding induration or erythema.  There is only a little bit of firmness around the incision site itself consistent with scarring.  Assessment/Plan: This is a 20 y.o. male s/p I&D of left scrotal abscess.  - Patient is healing well.  Discussed with him to continue his antibiotics until the course is completed. - Follow-up as needed.   26, MD [facility]

## 2021-07-28 LAB — ANAEROBIC AND AEROBIC CULTURE

## 2021-08-07 ENCOUNTER — Ambulatory Visit
Admission: RE | Admit: 2021-08-07 | Discharge: 2021-08-07 | Disposition: A | Discharge status: Home / Self Care | Payer: Medicaid Other | Source: Ambulatory Visit | Attending: Emergency Medicine | Admitting: Emergency Medicine

## 2021-08-07 ENCOUNTER — Other Ambulatory Visit: Payer: Self-pay

## 2021-08-07 VITALS — BP 142/82 | HR 56 | Temp 98.3°F | Resp 18

## 2021-08-07 DIAGNOSIS — Z20822 Contact with and (suspected) exposure to covid-19: Secondary | ICD-10-CM | POA: Diagnosis not present

## 2021-08-07 DIAGNOSIS — B349 Viral infection, unspecified: Secondary | ICD-10-CM | POA: Diagnosis not present

## 2021-08-07 NOTE — ED Triage Notes (Addendum)
Pt here after exposure to covid and has headache, body aches and fever x 2 days.

## 2021-08-07 NOTE — ED Provider Notes (Signed)
[facility]    CSN: [id number] Arrival date & time: 08/07/21  1230      History   Chief Complaint Chief Complaint  Patient presents with   Headache   Fever   Generalized Body Aches    HPI Darren Jones is a 20 y.o. male.  Patient presents with 2-day history of fever, chills, body aches, headache.  He reports exposure to COVID.  He denies rash, sore throat, cough, shortness of breath, vomiting, diarrhea, or other symptoms.  OTC treatment attempted at home.  His medical history includes hypertension.  The history is provided by the patient and medical records.   Past Medical History:  Diagnosis Date   Heart murmur    Hypertension     There are no problems to display for this patient.   Past Surgical History:  Procedure Laterality Date   INCISION AND DRAINAGE ABSCESS Left 04/26/2020       Home Medications    Prior to Admission medications   Medication Sig Start Date End Date Taking? Authorizing Provider  lisinopril (ZESTRIL) 10 MG tablet Take 1 tablet (10 mg total) by mouth daily. 12/28/20   Candis Schatz, PA-C  albuterol (VENTOLIN HFA) 108 (90 Base) MCG/ACT inhaler Inhale 2 puffs into the lungs every 4 (four) hours as needed for wheezing or shortness of breath. 07/16/20 08/09/20  Mickie Bail, NP    Family History Family History  Problem Relation Age of Onset   Healthy Mother    Hypertension Father    Diabetes Father     Social History Social History   Tobacco Use   Smoking status: Former    Types: Cigars   Smokeless tobacco: Never  Vaping Use   Vaping Use: Never used  Substance Use Topics   Alcohol use: Never   Drug use: Yes    Types: Marijuana     Allergies   Doxycycline   Review of Systems Review of Systems  Constitutional:  Positive for chills, fatigue and fever.  HENT:  Negative for ear pain and sore throat.   Respiratory:  Negative for cough and shortness of breath.   Cardiovascular:  Negative for chest pain and  palpitations.  Gastrointestinal:  Negative for abdominal pain, diarrhea and vomiting.  Skin:  Negative for color change and rash.  Neurological:  Positive for headaches. Negative for seizures and syncope.  All other systems reviewed and are negative.   Physical Exam Triage Vital Signs ED Triage Vitals  Enc Vitals Group     BP 08/07/21 1312 (!) 142/82     Pulse Rate 08/07/21 1312 (!) 56     Resp 08/07/21 1312 18     Temp 08/07/21 1312 98.3 F (36.8 C)     Temp Source 08/07/21 1312 Oral     SpO2 08/07/21 1312 98 %     Weight --      Height --      Head Circumference --      Peak Flow --      Pain Score 08/07/21 1316 5     Pain Loc --      Pain Edu? --      Excl. in GC? --    No data found.  Updated Vital Signs BP (!) 142/82 (BP Location: Left Arm)   Pulse (!) 56   Temp 98.3 F (36.8 C) (Oral)   Resp 18   SpO2 98%   Visual Acuity Right Eye Distance:   Left Eye Distance:   Bilateral  Distance:    Right Eye Near:   Left Eye Near:    Bilateral Near:     Physical Exam Vitals and nursing note reviewed.  Constitutional:      General: He is not in acute distress.    Appearance: He is well-developed.  HENT:     Head: Normocephalic and atraumatic.     Right Ear: Tympanic membrane normal.     Left Ear: Tympanic membrane normal.     Nose: Nose normal.     Mouth/Throat:     Mouth: Mucous membranes are moist.     Pharynx: Oropharynx is clear.  Eyes:     Conjunctiva/sclera: Conjunctivae normal.  Cardiovascular:     Rate and Rhythm: Normal rate and regular rhythm.     Heart sounds: Normal heart sounds.  Pulmonary:     Effort: Pulmonary effort is normal. No respiratory distress.     Breath sounds: Normal breath sounds.  Abdominal:     Palpations: Abdomen is soft.     Tenderness: There is no abdominal tenderness.  Musculoskeletal:     Cervical back: Neck supple.  Skin:    General: Skin is warm and dry.  Neurological:     General: No focal deficit present.      Mental Status: He is alert and oriented to person, place, and time.     Gait: Gait normal.  Psychiatric:        Mood and Affect: Mood normal.        Behavior: Behavior normal.     UC Treatments / Results  Labs (all labs ordered are listed, but only abnormal results are displayed) Labs Reviewed  NOVEL CORONAVIRUS, NAA    EKG   Radiology No results found.  Procedures Procedures (including critical care time)  Medications Ordered in UC Medications - No data to display  Initial Impression / Assessment and Plan / UC Course  I have reviewed the triage vital signs and the nursing notes.  Pertinent labs & imaging results that were available during my care of the patient were reviewed by me and considered in my medical decision making (see chart for details).   Viral illness, Exposure to COVID.  COVID pending.  Instructed patient to self quarantine per CDC guidelines.  Discussed symptomatic treatment including Tylenol or ibuprofen, rest, hydration.  Instructed patient to follow up with PCP if symptoms are not improving.  Patient agrees to plan of care.    Final Clinical Impressions(s) / UC Diagnoses   Final diagnoses:  Viral illness  Exposure to COVID-19 virus     Discharge Instructions      Your COVID test is pending.  You should self quarantine until the test result is back.    Take Tylenol or ibuprofen as needed for fever or discomfort.  Rest and keep yourself hydrated.    Follow-up with your primary care provider if your symptoms are not improving.         ED Prescriptions   None    PDMP not reviewed this encounter.   Mickie Bail, NP 08/07/21 [phone]

## 2021-08-07 NOTE — Discharge Instructions (Addendum)
Your COVID test is pending.  You should self quarantine until the test result is back.    Take Tylenol or ibuprofen as needed for fever or discomfort.  Rest and keep yourself hydrated.    Follow-up with your primary care provider if your symptoms are not improving.     

## 2021-08-09 LAB — NOVEL CORONAVIRUS, NAA: SARS-CoV-2, NAA: NOT DETECTED

## 2021-08-09 LAB — SARS-COV-2, NAA 2 DAY TAT

## 2021-11-05 ENCOUNTER — Emergency Department: Payer: Medicaid Other

## 2021-11-05 ENCOUNTER — Emergency Department
Admission: EM | Admit: 2021-11-05 | Discharge: 2021-11-05 | Disposition: A | Discharge status: Home / Self Care | Payer: Medicaid Other | Attending: Emergency Medicine | Admitting: Emergency Medicine

## 2021-11-05 ENCOUNTER — Other Ambulatory Visit: Payer: Self-pay

## 2021-11-05 DIAGNOSIS — I10 Essential (primary) hypertension: Secondary | ICD-10-CM | POA: Diagnosis not present

## 2021-11-05 DIAGNOSIS — X509XXA Other and unspecified overexertion or strenuous movements or postures, initial encounter: Secondary | ICD-10-CM | POA: Diagnosis not present

## 2021-11-05 DIAGNOSIS — Z87891 Personal history of nicotine dependence: Secondary | ICD-10-CM | POA: Diagnosis not present

## 2021-11-05 DIAGNOSIS — S93431A Sprain of tibiofibular ligament of right ankle, initial encounter: Secondary | ICD-10-CM | POA: Diagnosis not present

## 2021-11-05 DIAGNOSIS — M25571 Pain in right ankle and joints of right foot: Secondary | ICD-10-CM

## 2021-11-05 DIAGNOSIS — Z79899 Other long term (current) drug therapy: Secondary | ICD-10-CM | POA: Diagnosis not present

## 2021-11-05 DIAGNOSIS — S99911A Unspecified injury of right ankle, initial encounter: Secondary | ICD-10-CM | POA: Diagnosis present

## 2021-11-05 DIAGNOSIS — S93491A Sprain of other ligament of right ankle, initial encounter: Secondary | ICD-10-CM
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
## 2021-11-05 NOTE — ED Notes (Signed)
Xray at bedside. Pt ambulatory to room but with pain. Pt states he rolled his R ankle last night while getting his little kids into the car. He felt something "pop". He feels pain along entire lateral aspect of R foot and states it is painful to move small toe.

## 2021-11-05 NOTE — Discharge Instructions (Signed)
Please take Tylenol and ibuprofen/Advil for your pain.  It is safe to take them together, or to alternate them every few hours.  Take up to 1000mg of Tylenol at a time, up to 4 times per day.  Do not take more than 4000 mg of Tylenol in 24 hours.  For ibuprofen, take 400-600 mg, 4-5 times per day. ° ° °

## 2021-11-05 NOTE — ED Notes (Signed)
Pedal pulse strong prior and after CAM Boot applied.
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
## 2021-12-10 ENCOUNTER — Encounter: Payer: Self-pay | Admitting: Emergency Medicine

## 2021-12-10 ENCOUNTER — Ambulatory Visit
Admission: EM | Admit: 2021-12-10 | Discharge: 2021-12-10 | Disposition: A | Discharge status: Home / Self Care | Payer: Medicaid Other | Attending: Emergency Medicine | Admitting: Emergency Medicine

## 2021-12-10 DIAGNOSIS — T22212A Burn of second degree of left forearm, initial encounter: Secondary | ICD-10-CM

## 2021-12-10 DIAGNOSIS — T22112A Burn of first degree of left forearm, initial encounter: Secondary | ICD-10-CM

## 2021-12-10 MED ORDER — IBUPROFEN 600 MG PO TABS: 600.0000 mg | ORAL_TABLET | Freq: Four times a day (QID) | ORAL | 0 refills | Status: AC | PRN

## 2021-12-10 MED ORDER — CEPHALEXIN 500 MG PO CAPS: 1000.0000 mg | ORAL_CAPSULE | Freq: Two times a day (BID) | ORAL | 0 refills | Status: AC
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
## 2021-12-10 NOTE — Discharge Instructions (Addendum)
keep clean with soap and water, then apply bacitracin,, then the Vaseline gauze dressing, keep it covered during the day while you are out and at night.  Clean it before you dress it again.  Take 600 mg of ibuprofen combined with 1000 mg of Tylenol 3-4 times a day as needed for pain.  Keflex if the bacitracin does not work.  Follow-up with PCP or here if not improving with the Keflex.

## 2021-12-23 IMAGING — CT CT ABD-PELV W/O CM
2 of 4 series · 16 of 46 positions shown, 18 images · non-contrast
Comparison: Insert none

CLINICAL DATA: Right lower quadrant abdominal pain.

EXAM:
CT ABDOMEN AND PELVIS WITHOUT CONTRAST
TECHNIQUE: Multidetector CT imaging of the abdomen and pelvis was performed
following the standard protocol without IV contrast.

[Series 2: routine abd/pel wo · axial · 0.59mm/px · z∈[-966,-616]mm · 13 of 78 slices shown, 15 images]
[im 4/78  soft-tissue]
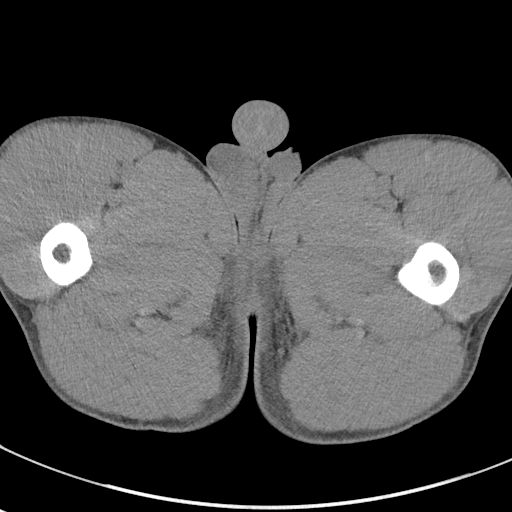
[im 4/78  bone]
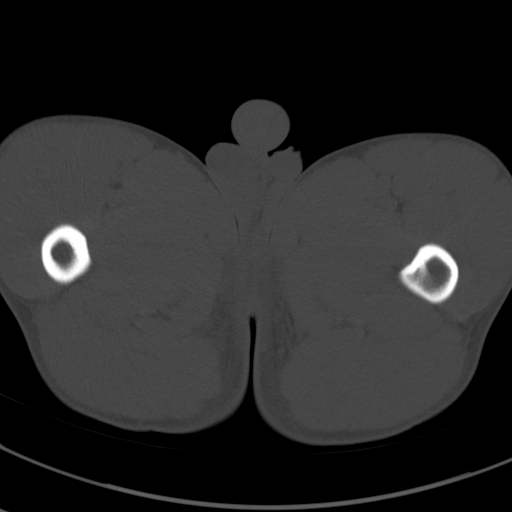
[im 10/78  soft-tissue]
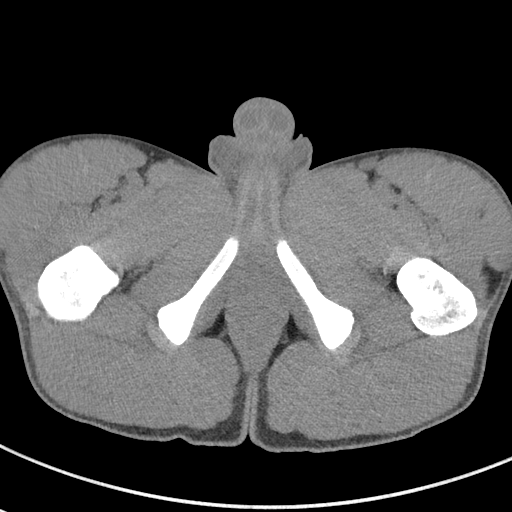
[im 16/78  soft-tissue]
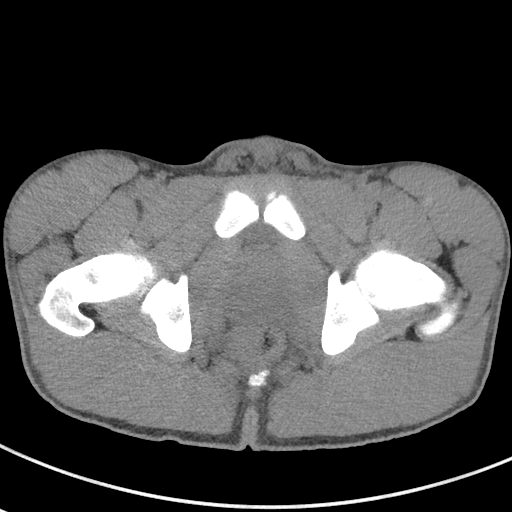
[im 22/78  soft-tissue]
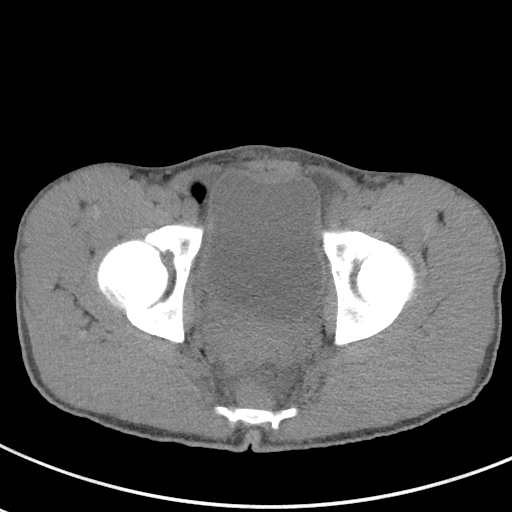
[im 28/78  soft-tissue]
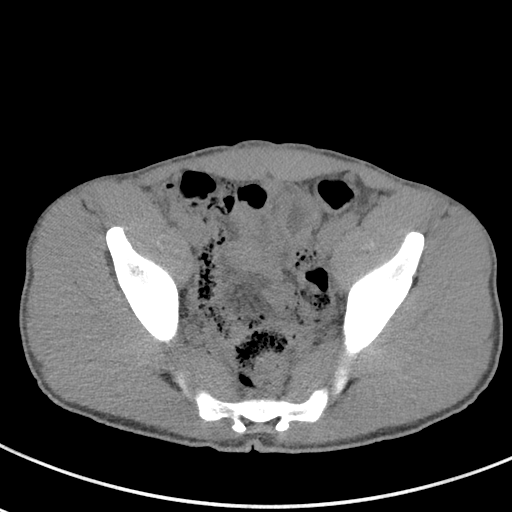
[im 34/78  soft-tissue]
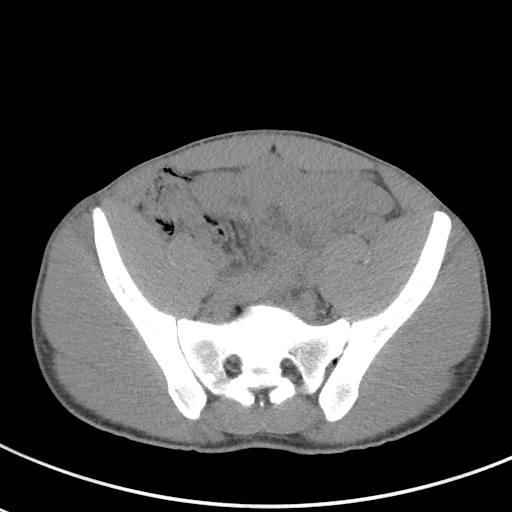
[im 41/78  soft-tissue]
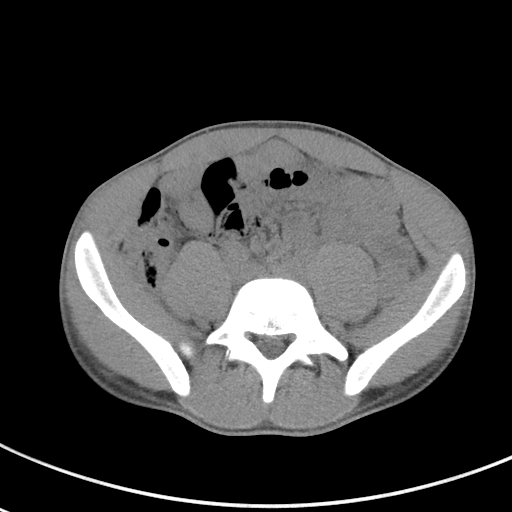
[im 44/78  soft-tissue]
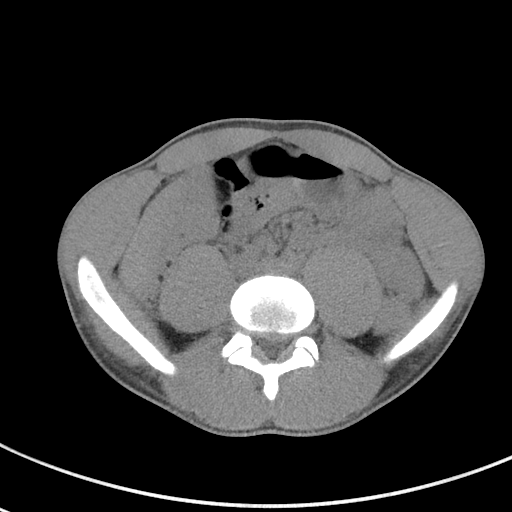
[im 50/78  soft-tissue]
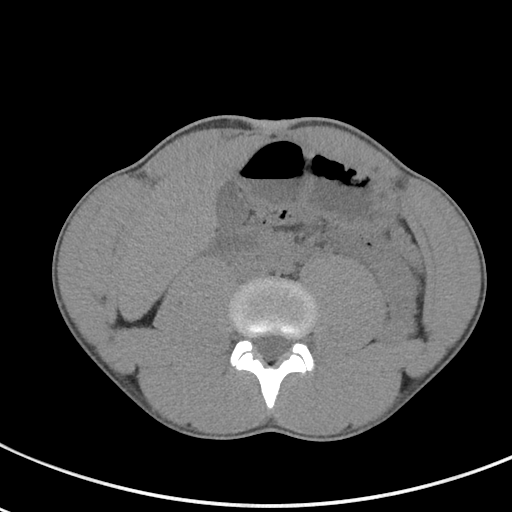
[im 50/78  bone]
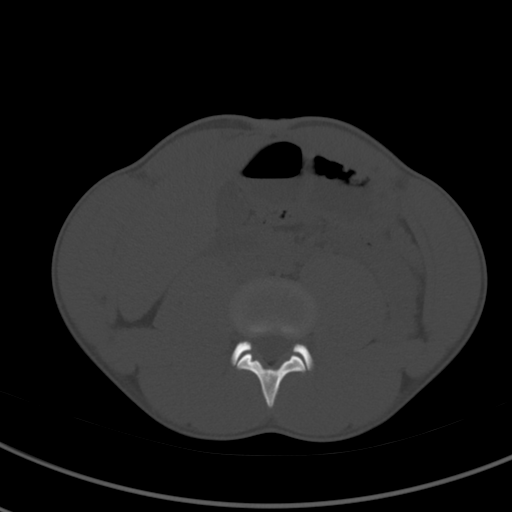
[im 56/78  soft-tissue]
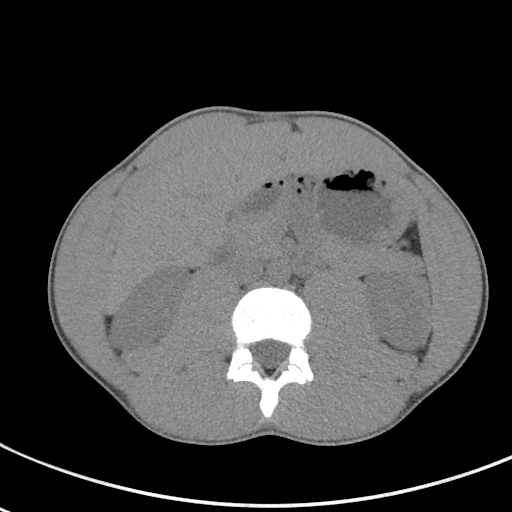
[im 62/78  soft-tissue]
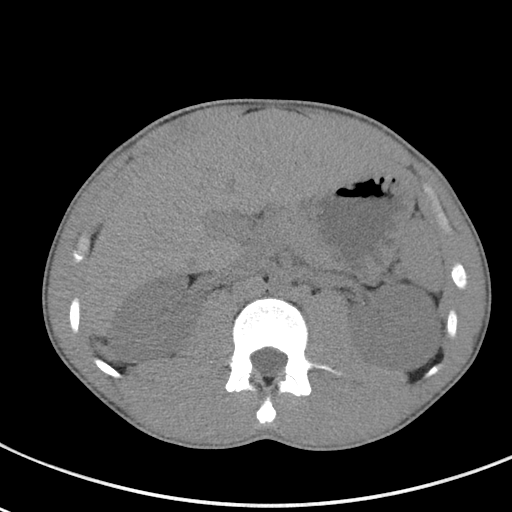
[im 68/78  soft-tissue]
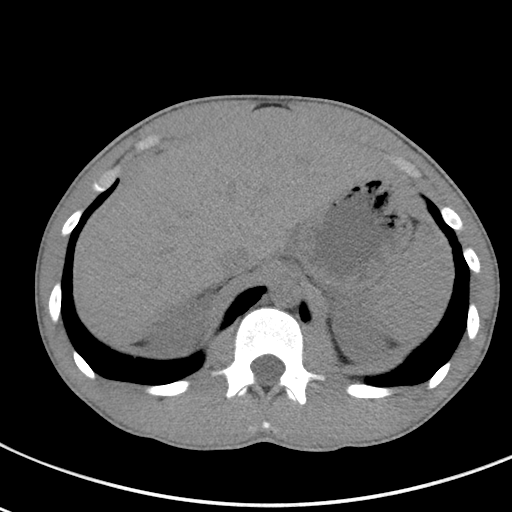
[im 74/78  soft-tissue]
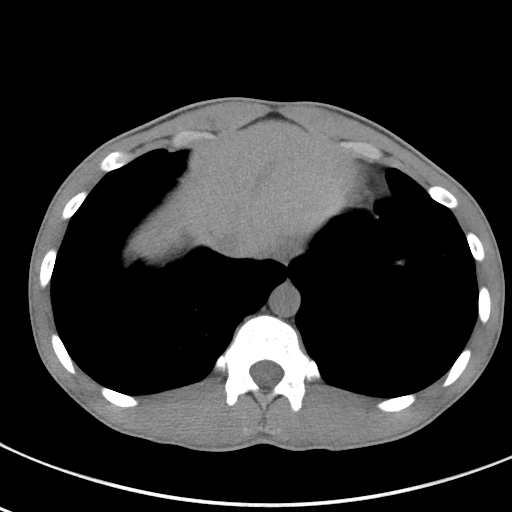

[Series 5: coronal st · coronal · 0.65mm/px · 3 of 73 slices shown]
[im 25/73  soft-tissue]
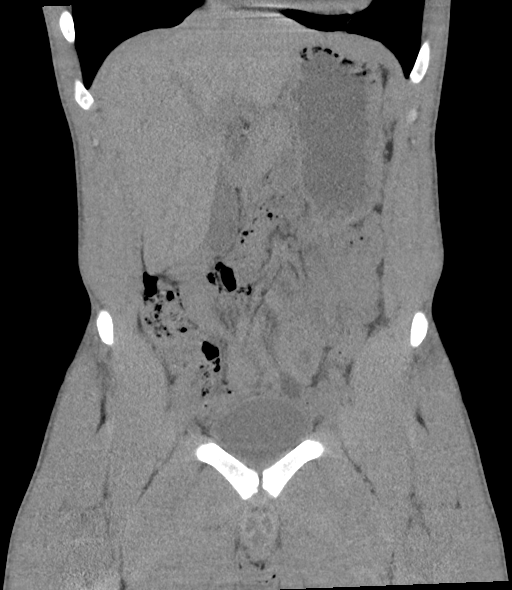
[im 33/73  soft-tissue]
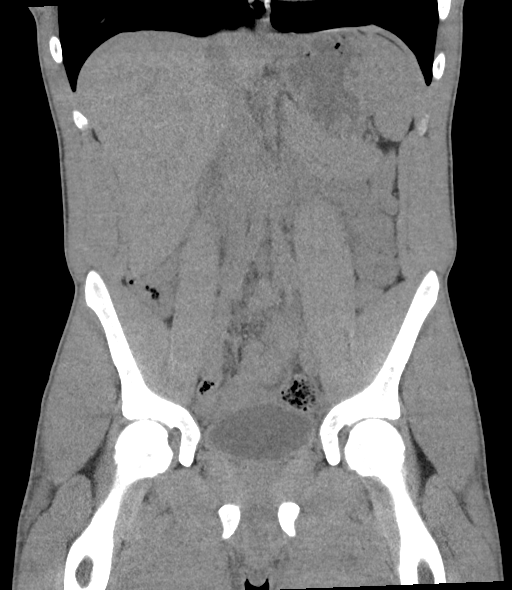
[im 41/73  soft-tissue]
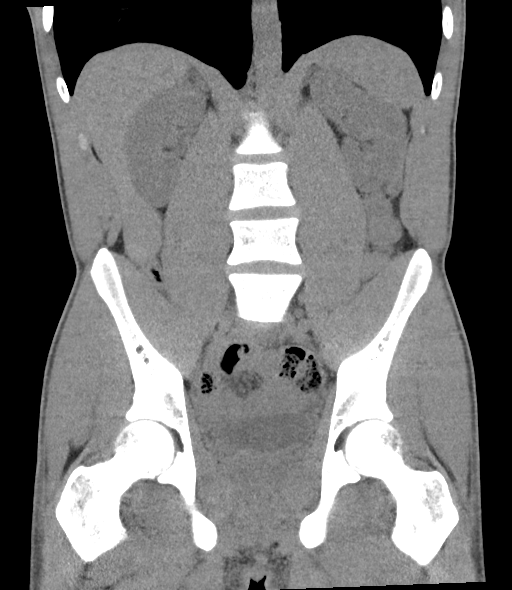

[16 of 46 positions shown; findings below may reference images not displayed]

FINDINGS: Lower chest: Unremarkable

Hepatobiliary: No focal abnormality in the liver on this study
without intravenous contrast. There is no evidence for gallstones,
gallbladder wall thickening, or pericholecystic fluid. No
intrahepatic or extrahepatic biliary dilation.

Pancreas: No focal mass lesion. No dilatation of the main duct. No
intraparenchymal cyst. No peripancreatic edema.

Spleen: No splenomegaly. No focal mass lesion.

Adrenals/Urinary Tract: No adrenal nodule or mass. Kidneys
unremarkable. No evidence for hydroureter The urinary bladder
appears normal for the degree of distention.

Stomach/Bowel: Stomach is unremarkable. No gastric wall thickening.
No evidence of outlet obstruction. Duodenum is normally positioned
as is the ligament of Treitz. No small bowel wall thickening. No
small bowel dilatation. Neither the terminal ileum nor the appendix
are discretely visible on today's study due to lack of
intraabdominal fat and lack of oral/intravenous contrast material.
Multiple areas of linear bead like gas collections are seen in the
right lower quadrant (see axial 39/2) indeterminate on this
noncontrast study and potentially related to decompressed bowel
although pneumatosis could have this appearance. Colon is
decompressed.

Vascular/Lymphatic: No abdominal aortic aneurysm. No evidence for
abdominopelvic lymphadenopathy although assessment limited by lack
of intravenous contrast material.

Reproductive: The prostate gland and seminal vesicles are
unremarkable.

Other: No substantial intraperitoneal free fluid.

Musculoskeletal: No worrisome lytic or sclerotic osseous
abnormality.
IMPRESSION: 1. No evidence for urinary stone disease.
2. Neither the terminal ileum nor the appendix are discretely
visible on today's study due to lack of intraabdominal fat and lack
of oral/intravenous contrast material. Multiple areas of linear bead
like gas collection are seen in the right lower quadrant. These are
indeterminate on this noncontrast study and potentially related to
decompressed bowel although pneumatosis could have this appearance.
If there is clinical concern for bowel pathology, repeat CT with
oral and IV contrast recommended to further evaluate.
3. No substantial intraperitoneal free fluid.

## 2022-03-18 ENCOUNTER — Encounter: Payer: Self-pay | Admitting: Emergency Medicine

## 2022-03-18 ENCOUNTER — Ambulatory Visit
Admission: EM | Admit: 2022-03-18 | Discharge: 2022-03-18 | Disposition: A | Discharge status: Home / Self Care | Payer: Medicaid Other | Attending: Emergency Medicine | Admitting: Emergency Medicine

## 2022-03-18 DIAGNOSIS — Z113 Encounter for screening for infections with a predominantly sexual mode of transmission: Secondary | ICD-10-CM | POA: Diagnosis present

## 2022-03-18 NOTE — ED Triage Notes (Signed)
Pt presents for STD testing. Pt denies any symptoms his girlfriend is having vaginal discharge and he wants to get checked. ?

## 2022-03-18 NOTE — Discharge Instructions (Addendum)
Labs pending 2-3 days, you will be contacted if positive for any sti and treatment will be sent to the pharmacy, you will have to return to the clinic if positive for gonorrhea or syphilis  to receive treatment  ? ?Please refrain from having sex until labs results, if positive please refrain from having sex until treatment complete and symptoms resolve  ? ?If positive for HIV, Syphilis, Chlamydia  gonorrhea or trichomoniasis please notify partner or partners so they may tested as well ? ?Moving forward, it is recommended you use some form of protection against the transmission of sti infections  such as condoms or dental dams with each sexual encounter   ?

## 2022-03-18 NOTE — ED Provider Notes (Signed)
?[facility] ? ? ? ?CSN: [id number] ?Arrival date & time: 03/18/22  1205 ? ? ?  ? ?History   ?Chief Complaint ?Chief Complaint  ?Patient presents with  ? STD Testing   ? ? ?HPI ?Darren Jones is a 21 y.o. male.  ? ?Patient presents requesting routine STD testing.  Denies all symptoms.  Endorses partner currently has vaginal discharge and plans to receive STD testing tomorrow.  Sexually active, 1 partner, no condom use. ? ?Past Medical History:  ?Diagnosis Date  ? Heart murmur   ? Hypertension   ? ? ?There are no problems to display for this patient. ? ? ?Past Surgical History:  ?Procedure Laterality Date  ? INCISION AND DRAINAGE ABSCESS Left 04/26/2020  ? ? ? ? ? ?Home Medications   ? ?Prior to Admission medications   ?Medication Sig Start Date End Date Taking? Authorizing Provider  ?lisinopril (ZESTRIL) 10 MG tablet Take 1 tablet (10 mg total) by mouth daily. 12/28/20   Luvenia Redden, PA-C  ?albuterol (VENTOLIN HFA) 108 (90 Base) MCG/ACT inhaler Inhale 2 puffs into the lungs every 4 (four) hours as needed for wheezing or shortness of breath. 07/16/20 08/09/20  Sharion Balloon, NP  ? ? ?Family History ?Family History  ?Problem Relation Age of Onset  ? Healthy Mother   ? Hypertension Father   ? Diabetes Father   ? ? ?Social History ?Social History  ? ?Tobacco Use  ? Smoking status: Every Day  ?  Types: Cigars  ? Smokeless tobacco: Never  ?Vaping Use  ? Vaping Use: Never used  ?Substance Use Topics  ? Alcohol use: Never  ? Drug use: Yes  ?  Types: Marijuana  ? ? ? ?Allergies   ?Doxycycline and Penicillins ? ? ?Review of Systems ?Review of Systems  ?Constitutional: Negative.   ?Respiratory: Negative.    ?Cardiovascular: Negative.   ?Genitourinary: Negative.   ?Skin: Negative.   ? ? ?Physical Exam ?Triage Vital Signs ?ED Triage Vitals  ?Enc Vitals Group  ?   BP 03/18/22 1228 (!) 150/75  ?   Pulse Rate 03/18/22 1228 92  ?   Resp 03/18/22 1228 16  ?   Temp 03/18/22 1228 98.9 ?F (37.2 ?C)  ?   Temp Source 03/18/22  1228 Oral  ?   SpO2 03/18/22 1228 98 %  ?   Weight --   ?   Height --   ?   Head Circumference --   ?   Peak Flow --   ?   Pain Score 03/18/22 1227 0  ?   Pain Loc --   ?   Pain Edu? --   ?   Excl. in [place]? --   ? ?No data found. ? ?Updated Vital Signs ?BP (!) 150/75 (BP Location: Left Arm)   Pulse 92   Temp 98.9 ?F (37.2 ?C) (Oral)   Resp 16   SpO2 98%  ? ?Visual Acuity ?Right Eye Distance:   ?Left Eye Distance:   ?Bilateral Distance:   ? ?Right Eye Near:   ?Left Eye Near:    ?Bilateral Near:    ? ?Physical Exam ?Constitutional:   ?   Appearance: Normal appearance.  ?Eyes:  ?   Extraocular Movements: Extraocular movements intact.  ?Pulmonary:  ?   Effort: Pulmonary effort is normal.  ?Genitourinary: ?   Comments: Deferred, self collect penile swab ?Neurological:  ?   Mental Status: He is alert and oriented to person, place, and time.  ?Psychiatric:     ?  Mood and Affect: Mood normal.     ?   Behavior: Behavior normal.  ? ? ? ?UC Treatments / Results  ?Labs ?(all labs ordered are listed, but only abnormal results are displayed) ?Labs Reviewed  ?HIV ANTIBODY (ROUTINE TESTING W REFLEX)  ?RPR  ?CYTOLOGY, (ORAL, ANAL, URETHRAL) ANCILLARY ONLY  ? ? ?EKG ? ? ?Radiology ?No results found. ? ?Procedures ?Procedures (including critical care time) ? ?Medications Ordered in UC ?Medications - No data to display ? ?Initial Impression / Assessment and Plan / UC Course  ?I have reviewed the triage vital signs and the nursing notes. ? ?Pertinent labs & imaging results that were available during my care of the patient were reviewed by me and considered in my medical decision making (see chart for details). ? ?Screening for STD ? ?STD labs are pending, will treat per protocol, advised abstinence until lab results, advised abstinence until his partner is tested and received lab results/treatment, if positive advised abstinence until all treatment is complete, advised condom use during all sexual encounters moving forward, may  follow-up with urgent care as needed ?Final Clinical Impressions(s) / UC Diagnoses  ? ?Final diagnoses:  ?Screening for STD (sexually transmitted disease)  ? ? ? ?Discharge Instructions   ? ?  ?Labs pending 2-3 days, you will be contacted if positive for any sti and treatment will be sent to the pharmacy, you will have to return to the clinic if positive for gonorrhea or syphilis  to receive treatment  ? ?Please refrain from having sex until labs results, if positive please refrain from having sex until treatment complete and symptoms resolve  ? ?If positive for HIV, Syphilis, Chlamydia  gonorrhea or trichomoniasis please notify partner or partners so they may tested as well ? ?Moving forward, it is recommended you use some form of protection against the transmission of sti infections  such as condoms or dental dams with each sexual encounter   ? ? ?ED Prescriptions   ?None ?  ? ?PDMP not reviewed this encounter. ?  ?Hans Eden, NP ?03/18/22 1248 ? ?

## 2022-03-19 ENCOUNTER — Telehealth (HOSPITAL_COMMUNITY): Payer: Self-pay | Admitting: Emergency Medicine

## 2022-03-19 LAB — CYTOLOGY, (ORAL, ANAL, URETHRAL) ANCILLARY ONLY
Chlamydia: POSITIVE — AB
Comment: NEGATIVE
Comment: NEGATIVE
Comment: NORMAL
Neisseria Gonorrhea: NEGATIVE
Trichomonas: NEGATIVE

## 2022-03-19 LAB — HIV ANTIBODY (ROUTINE TESTING W REFLEX): HIV Screen 4th Generation wRfx: NONREACTIVE

## 2022-03-19 LAB — RPR: RPR Ser Ql: NONREACTIVE

## 2022-03-19 MED ORDER — AZITHROMYCIN 250 MG PO TABS: 1000.0000 mg | ORAL_TABLET | Freq: Once | ORAL | 0 refills | Status: AC

## 2022-04-19 ENCOUNTER — Encounter: Payer: Self-pay | Admitting: Emergency Medicine

## 2022-04-19 ENCOUNTER — Ambulatory Visit
Admission: EM | Admit: 2022-04-19 | Discharge: 2022-04-19 | Disposition: A | Discharge status: Home / Self Care | Payer: Medicaid Other | Attending: Family Medicine | Admitting: Family Medicine

## 2022-04-19 DIAGNOSIS — R369 Urethral discharge, unspecified: Secondary | ICD-10-CM | POA: Insufficient documentation

## 2022-04-19 DIAGNOSIS — Z711 Person with feared health complaint in whom no diagnosis is made: Secondary | ICD-10-CM | POA: Diagnosis present

## 2022-04-19 MED ORDER — CEFTRIAXONE SODIUM 500 MG IJ SOLR
500.0000 mg | Freq: Once | INTRAMUSCULAR | Status: AC
  Administered 2022-04-19: 500 mg via INTRAMUSCULAR

## 2022-04-19 MED ORDER — AZITHROMYCIN 500 MG PO TABS: 1000.0000 mg | ORAL_TABLET | Freq: Once | ORAL | 0 refills | Status: AC

## 2022-04-19 NOTE — ED Provider Notes (Signed)
[name]    CSN: [id number] Arrival date & time: 04/19/22  1141      History   Chief Complaint Chief Complaint  Patient presents with   SEXUALLY TRANSMITTED DISEASE    HPI Marselino Fussell is a 21 y.o. male.   HPI Patient presents today for treatment of a suspected STD. Patient was seen here in clinic in April and treated for chlamydia. His partner was diagnosed with the same and he doesn't feel that they waited the full 7 days following treatment to be sexually active again. Patient endorses some dysuria along with penile discharge which has had intermittently since April but this week it turned yellow and thick.  He denies any other symptoms  Past Medical History:  Diagnosis Date   Heart murmur    Hypertension     There are no problems to display for this patient.   Past Surgical History:  Procedure Laterality Date   INCISION AND DRAINAGE ABSCESS Left 04/26/2020       Home Medications    Prior to Admission medications   Medication Sig Start Date End Date Taking? Authorizing Provider  azithromycin (ZITHROMAX) 500 MG tablet Take 2 tablets (1,000 mg total) by mouth once for 1 dose. 04/19/22 04/19/22 Yes Bing Neighbors, FNP  lisinopril (ZESTRIL) 10 MG tablet Take 1 tablet (10 mg total) by mouth daily. 12/28/20   Candis Schatz, PA-C  albuterol (VENTOLIN HFA) 108 (90 Base) MCG/ACT inhaler Inhale 2 puffs into the lungs every 4 (four) hours as needed for wheezing or shortness of breath. 07/16/20 08/09/20  Mickie Bail, NP    Family History Family History  Problem Relation Age of Onset   Healthy Mother    Hypertension Father    Diabetes Father     Social History Social History   Tobacco Use   Smoking status: Every Day    Types: Cigars   Smokeless tobacco: Never  Vaping Use   Vaping Use: Never used  Substance Use Topics   Alcohol use: Never   Drug use: Yes    Types: Marijuana     Allergies   Doxycycline and Penicillins   Review of  Systems Review of Systems Pertinent negatives listed in HPI   Physical Exam Triage Vital Signs ED Triage Vitals [04/19/22 1239]  Enc Vitals Group     BP 126/76     Pulse Rate 62     Resp 16     Temp 98.1 F (36.7 C)     Temp Source Oral     SpO2 100 %     Weight      Height      Head Circumference      Peak Flow      Pain Score      Pain Loc      Pain Edu?      Excl. in [place]?    No data found.  Updated Vital Signs BP 126/76 (BP Location: Left Arm)   Pulse 62   Temp 98.1 F (36.7 C) (Oral)   Resp 16   SpO2 100%   Visual Acuity Right Eye Distance:   Left Eye Distance:   Bilateral Distance:    Right Eye Near:   Left Eye Near:    Bilateral Near:     Physical Exam General appearance: Alert, well developed, well nourished, cooperative  Head: Normocephalic, without obvious abnormality, atraumatic Respiratory: Respirations even and unlabored, normal respiratory rate Heart: rate and rhythm normal.  Extremities: No  gross deformities Skin: Skin color, texture, turgor normal. No rashes seen  Psych: Appropriate mood and affect.   Self swab collected by patient UC Treatments / Results  Labs (all labs ordered are listed, but only abnormal results are displayed) Labs Reviewed  CYTOLOGY, (ORAL, ANAL, URETHRAL) ANCILLARY ONLY    EKG   Radiology No results found.  Procedures Procedures (including critical care time)  Medications Ordered in UC Medications  cefTRIAXone (ROCEPHIN) injection 500 mg (500 mg Intramuscular Given 04/19/22 1330)    Initial Impression / Assessment and Plan / UC Course  I have reviewed the triage vital signs and the nursing notes.  Pertinent labs & imaging results that were available during my care of the patient were reviewed by me and considered in my medical decision making (see chart for details).    Concern STD and Penile discharge Treatment with Rocephin 500 mg  Repeat treatment with Azithromycin Counseled strongly to avoid  sexual contact for 7 days following and treatment and ensure partner has been treated. Condoms and STI education given to patient.  Final Clinical Impressions(s) / UC Diagnoses   Final diagnoses:  Concern about STD in male without diagnosis  Penile discharge   Discharge Instructions   None    ED Prescriptions     Medication Sig Dispense Auth. Provider   azithromycin (ZITHROMAX) 500 MG tablet Take 2 tablets (1,000 mg total) by mouth once for 1 dose. 2 tablet Bing Neighbors, FNP      PDMP not reviewed this encounter.   Bing Neighbors, FNP 04/19/22 [phone]

## 2022-04-19 NOTE — ED Triage Notes (Signed)
Pt presents with penile discharge and dysuria x 1 week. He would like STD testing.

## 2022-04-22 LAB — CYTOLOGY, (ORAL, ANAL, URETHRAL) ANCILLARY ONLY
Chlamydia: POSITIVE — AB
Comment: NEGATIVE
Comment: NEGATIVE
Comment: NORMAL
Neisseria Gonorrhea: NEGATIVE
Trichomonas: NEGATIVE

## 2022-05-13 ENCOUNTER — Ambulatory Visit
Admission: EM | Admit: 2022-05-13 | Discharge: 2022-05-13 | Disposition: A | Discharge status: Home / Self Care | Payer: Medicaid Other | Attending: Family Medicine | Admitting: Family Medicine

## 2022-05-13 ENCOUNTER — Encounter: Payer: Self-pay | Admitting: Emergency Medicine

## 2022-05-13 DIAGNOSIS — Z711 Person with feared health complaint in whom no diagnosis is made: Secondary | ICD-10-CM

## 2022-05-13 NOTE — ED Triage Notes (Signed)
Pt presents for STD testing he denies any symptoms 

## 2022-05-13 NOTE — ED Provider Notes (Signed)
Darren Jones    CSN: [id number] Arrival date & time: 05/13/22  1015      History   Chief Complaint Chief Complaint  Patient presents with   STD Testing     HPI Darren Jones is a 21 y.o. male.   HPI Patient with history of recurrent Chlamydia, presents today for repeat STD testing. Patient has been treated twice for Chlamydia during the months of April and May. He has no symptoms today.  Past Medical History:  Diagnosis Date   Heart murmur    Hypertension     There are no problems to display for this patient.   Past Surgical History:  Procedure Laterality Date   INCISION AND DRAINAGE ABSCESS Left 04/26/2020       Home Medications    Prior to Admission medications   Medication Sig Start Date End Date Taking? Authorizing Provider  lisinopril (ZESTRIL) 10 MG tablet Take 1 tablet (10 mg total) by mouth daily. 12/28/20   Candis Schatz, PA-C  albuterol (VENTOLIN HFA) 108 (90 Base) MCG/ACT inhaler Inhale 2 puffs into the lungs every 4 (four) hours as needed for wheezing or shortness of breath. 07/16/20 08/09/20  Mickie Bail, NP    Family History Family History  Problem Relation Age of Onset   Healthy Mother    Hypertension Father    Diabetes Father     Social History Social History   Tobacco Use   Smoking status: Every Day    Types: Cigars   Smokeless tobacco: Never  Vaping Use   Vaping Use: Never used  Substance Use Topics   Alcohol use: Never   Drug use: Yes    Types: Marijuana     Allergies   Doxycycline and Penicillins   Review of Systems Review of Systems Pertinent negatives listed in HPI   Physical Exam Triage Vital Signs ED Triage Vitals [05/13/22 1047]  Enc Vitals Group     BP 133/83     Pulse Rate (!) 57     Resp 16     Temp 98 F (36.7 C)     Temp Source Oral     SpO2 98 %     Weight      Height      Head Circumference      Peak Flow      Pain Score      Pain Loc      Pain Edu?      Excl. in [place]?    No  data found.  Updated Vital Signs BP 133/83 (BP Location: Left Arm)   Pulse (!) 57   Temp 98 F (36.7 C) (Oral)   Resp 16   SpO2 98%   Visual Acuity Right Eye Distance:   Left Eye Distance:   Bilateral Distance:    Right Eye Near:   Left Eye Near:    Bilateral Near:     Physical Exam General appearance: Alert, well developed, well nourished, cooperative Head: Normocephalic, without obvious abnormality, atraumatic Heart: rate and rhythm normal. No gallop or murmurs noted on exam Respiratory: Respirations even and unlabored, normal respiratory rate Extremities: No gross deformities Skin: Skin color, texture, turgor normal. No rashes seen  Psych: Appropriate mood and affect. STD cytology self collected UC Treatments / Results  Labs (all labs ordered are listed, but only abnormal results are displayed) Labs Reviewed  CYTOLOGY, (ORAL, ANAL, URETHRAL) ANCILLARY ONLY    EKG   Radiology No results found.  Procedures Procedures (including  critical care time)  Medications Ordered in UC Medications - No data to display  Initial Impression / Assessment and Plan / UC Course  I have reviewed the triage vital signs and the nursing notes.  Pertinent labs & imaging results that were available during my care of the patient were reviewed by me and considered in my medical decision making (see chart for details).    Concern for STD, asymptomatic  Cytology results pending. Safe sex practices discussed. Return as needed. Final Clinical Impressions(s) / UC Diagnoses   Final diagnoses:  Concern about STD in male without diagnosis   Discharge Instructions   None    ED Prescriptions   None    PDMP not reviewed this encounter.   Bing Neighbors, FNP 05/13/22 1110

## 2022-05-13 NOTE — Discharge Instructions (Signed)
Your labs will result within 2-3 business days.  If any treatment is warranted we will contact you via MyChart or by phone.

## 2022-05-14 LAB — CYTOLOGY, (ORAL, ANAL, URETHRAL) ANCILLARY ONLY
Chlamydia: POSITIVE — AB
Comment: NEGATIVE
Comment: NEGATIVE
Comment: NORMAL
Neisseria Gonorrhea: NEGATIVE
Trichomonas: NEGATIVE

## 2022-05-15 ENCOUNTER — Telehealth (HOSPITAL_COMMUNITY): Payer: Self-pay | Admitting: Emergency Medicine

## 2022-05-15 MED ORDER — AZITHROMYCIN 250 MG PO TABS: 1000.0000 mg | ORAL_TABLET | Freq: Once | ORAL | 0 refills | Status: AC

## 2022-05-16 ENCOUNTER — Telehealth (HOSPITAL_COMMUNITY): Payer: Self-pay | Admitting: Emergency Medicine

## 2022-05-22 ENCOUNTER — Encounter: Payer: Self-pay | Admitting: Emergency Medicine

## 2022-05-22 ENCOUNTER — Ambulatory Visit
Admission: EM | Admit: 2022-05-22 | Discharge: 2022-05-22 | Disposition: A | Discharge status: Home / Self Care | Payer: Medicaid Other | Attending: Family Medicine | Admitting: Family Medicine

## 2022-05-22 DIAGNOSIS — J038 Acute tonsillitis due to other specified organisms: Secondary | ICD-10-CM

## 2022-05-22 LAB — POCT RAPID STREP A (OFFICE): Rapid Strep A Screen: NEGATIVE

## 2022-05-22 MED ORDER — CLINDAMYCIN HCL 300 MG PO CAPS
300.0000 mg | ORAL_CAPSULE | Freq: Three times a day (TID) | ORAL | 0 refills | Status: AC
Start: 2022-05-22 — End: 2022-05-29

## 2022-05-22 NOTE — Discharge Instructions (Signed)
You have a Penicillin Allergy. I am treating you with Clindamycin for tonsillitis. If your symptoms worsen or do not improve following completion of antibiotic return for evaluation.

## 2022-05-22 NOTE — ED Triage Notes (Signed)
Pt presents with ST, fever, and HA x 3 days

## 2022-05-22 NOTE — ED Provider Notes (Signed)
Darren Jones    CSN: [id number] Arrival date & time: 05/22/22  1520      History   Chief Complaint Chief Complaint  Patient presents with   Sore Throat   Fever   Headache    HPI Darren Jones is a 21 y.o. male.   HPI Patient with a history of hypertension presents today for evaluation of 3 days of sore throat, fever and headache. No known sick contacts. He denies any other associated URI symptoms.  Last had fever yesterday and reports it was around 200.  He has been taken Tylenol every 4-6 hours for management of fever.  No history of recurrent strep.  Past Medical History:  Diagnosis Date   Heart murmur    Hypertension     There are no problems to display for this patient.   Past Surgical History:  Procedure Laterality Date   INCISION AND DRAINAGE ABSCESS Left 04/26/2020       Home Medications    Prior to Admission medications   Medication Sig Start Date End Date Taking? Authorizing Provider  clindamycin (CLEOCIN) 300 MG capsule Take 1 capsule (300 mg total) by mouth 3 (three) times daily for 7 days. 05/22/22 05/29/22 Yes Bing Neighbors, FNP  lisinopril (ZESTRIL) 10 MG tablet Take 1 tablet (10 mg total) by mouth daily. 12/28/20   Candis Schatz, PA-C  albuterol (VENTOLIN HFA) 108 (90 Base) MCG/ACT inhaler Inhale 2 puffs into the lungs every 4 (four) hours as needed for wheezing or shortness of breath. 07/16/20 08/09/20  Mickie Bail, NP    Family History Family History  Problem Relation Age of Onset   Healthy Mother    Hypertension Father    Diabetes Father     Social History Social History   Tobacco Use   Smoking status: Every Day    Types: Cigars   Smokeless tobacco: Never  Vaping Use   Vaping Use: Never used  Substance Use Topics   Alcohol use: Never   Drug use: Yes    Types: Marijuana     Allergies   Doxycycline and Penicillins   Review of Systems Review of Systems Pertinent negatives listed in HPI   Physical  Exam Triage Vital Signs ED Triage Vitals  Enc Vitals Group     BP      Pulse      Resp      Temp      Temp src      SpO2      Weight      Height      Head Circumference      Peak Flow      Pain Score      Pain Loc      Pain Edu?      Excl. in GC?    No data found.  Updated Vital Signs BP (!) 137/91 (BP Location: Left Arm)   Pulse 75   Temp 98.5 F (36.9 C) (Oral)   Resp 16   SpO2 98%   Visual Acuity Right Eye Distance:   Left Eye Distance:   Bilateral Distance:    Right Eye Near:   Left Eye Near:    Bilateral Near:     Physical Exam Constitutional:      Appearance: He is ill-appearing.  HENT:     Head: Normocephalic and atraumatic.     Mouth/Throat:     Pharynx: Posterior oropharyngeal erythema and uvula swelling present.     Tonsils: Tonsillar  exudate present. 4+ on the right. 4+ on the left.  Cardiovascular:     Rate and Rhythm: Normal rate and regular rhythm.  Pulmonary:     Effort: Pulmonary effort is normal.     Breath sounds: Normal breath sounds.  Musculoskeletal:     Cervical back: Normal range of motion.  Lymphadenopathy:     Cervical: Cervical adenopathy present.  Skin:    General: Skin is warm and dry.     Capillary Refill: Capillary refill takes less than 2 seconds.  Neurological:     General: No focal deficit present.     Mental Status: He is alert.      UC Treatments / Results  Labs (all labs ordered are listed, but only abnormal results are displayed) Labs Reviewed  POCT RAPID STREP A (OFFICE)    EKG   Radiology No results found.  Procedures Procedures (including critical care time)  Medications Ordered in UC Medications - No data to display  Initial Impression / Assessment and Plan / UC Course  I have reviewed the triage vital signs and the nursing notes.  Pertinent labs & imaging results that were available during my care of the patient were reviewed by me and considered in my medical decision making (see chart  for details).    Acute Tonsillitis  Patient recently treated multiple times with azithromycin for recurrent chlamydia therefore opting to treat with clindamycin given other allergies to antibiotics.  Patient advised to complete entire course of medication.  Patient declined oral STD testing. He was advised to return if symptoms worsen or do not resolve with treatment. Final Clinical Impressions(s) / UC Diagnoses   Final diagnoses:  Acute tonsillitis due to other specified organisms     Discharge Instructions      You have a Penicillin Allergy. I am treating you with Clindamycin for tonsillitis. If your symptoms worsen or do not improve following completion of antibiotic return for evaluation.      ED Prescriptions     Medication Sig Dispense Auth. Provider   clindamycin (CLEOCIN) 300 MG capsule Take 1 capsule (300 mg total) by mouth 3 (three) times daily for 7 days. 21 capsule Bing Neighbors, FNP      PDMP not reviewed this encounter.   Bing Neighbors, FNP 05/22/22 1626

## 2022-06-18 DIAGNOSIS — N492 Inflammatory disorders of scrotum: Secondary | ICD-10-CM | POA: Insufficient documentation

## 2022-06-18 DIAGNOSIS — Z8619 Personal history of other infectious and parasitic diseases: Secondary | ICD-10-CM | POA: Insufficient documentation

## 2022-06-18 DIAGNOSIS — I10 Essential (primary) hypertension: Secondary | ICD-10-CM | POA: Insufficient documentation

## 2022-06-18 DIAGNOSIS — J029 Acute pharyngitis, unspecified: Secondary | ICD-10-CM | POA: Insufficient documentation

## 2022-06-18 DIAGNOSIS — F909 Attention-deficit hyperactivity disorder, unspecified type: Secondary | ICD-10-CM | POA: Insufficient documentation

## 2022-06-18 DIAGNOSIS — Z720 Tobacco use: Secondary | ICD-10-CM | POA: Insufficient documentation

## 2022-06-19 ENCOUNTER — Other Ambulatory Visit: Payer: Self-pay

## 2022-06-19 ENCOUNTER — Encounter: Payer: Self-pay | Admitting: Internal Medicine

## 2022-06-19 ENCOUNTER — Other Ambulatory Visit (HOSPITAL_COMMUNITY)
Admission: RE | Admit: 2022-06-19 | Discharge: 2022-06-19 | Disposition: A | Discharge status: Home / Self Care | Payer: Medicaid Other | Source: Ambulatory Visit | Attending: Internal Medicine | Admitting: Internal Medicine

## 2022-06-19 ENCOUNTER — Ambulatory Visit (INDEPENDENT_AMBULATORY_CARE_PROVIDER_SITE_OTHER): Payer: Medicaid Other | Admitting: Internal Medicine

## 2022-06-19 DIAGNOSIS — F909 Attention-deficit hyperactivity disorder, unspecified type: Secondary | ICD-10-CM | POA: Diagnosis not present

## 2022-06-19 DIAGNOSIS — J029 Acute pharyngitis, unspecified: Secondary | ICD-10-CM

## 2022-06-19 DIAGNOSIS — Z720 Tobacco use: Secondary | ICD-10-CM | POA: Diagnosis not present

## 2022-06-19 DIAGNOSIS — F32A Depression, unspecified: Secondary | ICD-10-CM

## 2022-06-19 DIAGNOSIS — Z8619 Personal history of other infectious and parasitic diseases: Secondary | ICD-10-CM | POA: Diagnosis present

## 2022-06-19 DIAGNOSIS — I10 Essential (primary) hypertension: Secondary | ICD-10-CM | POA: Diagnosis not present

## 2022-06-19 DIAGNOSIS — N492 Inflammatory disorders of scrotum: Secondary | ICD-10-CM | POA: Diagnosis not present

## 2022-06-19 NOTE — Patient Instructions (Signed)
    Counseling: [facility] [employer] ([email or web address] [phone]

## 2022-06-19 NOTE — Progress Notes (Signed)
[facility]  Reason for Consult: Recurrent scrotal abscesses, recurrent chlamydia and recent exudative pharyngitis Referring Provider: Dr. Loreta Ave  Assessment: He has no current symptoms to suggest recurrent/persistent chlamydia infection but I will test him for chlamydia, GC, syphilis and HIV again today.  His exudative pharyngitis has resolved.  I told him that shaving may exacerbate his recurrent scrotal abscesses.  I encouraged him to avoid doing this to see if his infection has become less frequent and less severe.  We have given him information on how to contact [employer] of the [place] for behavioral health counseling.  Plan: Testing for chlamydia, GC, syphilis and HIV.  Follow-up in 1 week  Patient Active Problem List   Diagnosis Date Noted   Exudative pharyngitis 06/18/2022   History of chlamydia 06/18/2022   Scrotal abscess 06/18/2022   Hypertension 06/18/2022   Tobacco use 06/18/2022   ADHD 06/18/2022    Patient's Medications  New Prescriptions   No medications on file  Previous Medications   LISINOPRIL (ZESTRIL) 10 MG TABLET    Take 1 tablet (10 mg total) by mouth daily.  Modified Medications   No medications on file  Discontinued Medications   No medications on file    HPI: Darren Jones is a 21 y.o. 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  He has had a habit of shaving his scrotum and has noted in the past that when he does not shave in the abscesses are not as frequent or severe.  When he starts to form a small boil he will apply hot compresses and pressure to help drain pus.  1 small area developed and drained on the left scrotum about 1 week ago.  That area is feeling better now.  He has been diagnosed with chlamydia and treated in April, May and again in June of this year.  He received azithromycin each time.  His RPR and HIV  antibody were both negative in April.  He was seen in urgent care on 05/22/2022 with fever and exudative pharyngitis.  His rapid strep test was negative.  He was treated with clindamycin and his illness resolved promptly.  He states that he believes he is in a mutually monogamous relationship with his girlfriend.  She has been treated for chlamydia recently as well.  She was tested again yesterday and tested negative.  He is requesting repeat testing.  He states that he has been struggling with depression for the last 4 to 5 years.  He has 2 sons, ages 89 and 85 years old who live with their mother.  At times he has struggled financially to care for them.  He sees them on a regular basis.  Review of Systems: Review of Systems  Constitutional:  Positive for weight loss. Negative for fever.       His weight can fluctuate as much as 10 to 15 pounds.  Respiratory:  Negative for cough.   Cardiovascular:  Positive for chest pain.  Gastrointestinal:  Positive for abdominal pain and heartburn. Negative for diarrhea, nausea and vomiting.  Genitourinary:  Negative for dysuria, frequency and urgency.       No penile discharge.  Psychiatric/Behavioral:  Positive for depression.       Past Medical History:  Diagnosis Date   Heart murmur    Hypertension     Social History   Tobacco Use   Smoking  status: Every Day    Types: Cigars   Smokeless tobacco: Never   Tobacco comments:    Black and milds (does more than one pack/day)  Vaping Use   Vaping Use: Never used  Substance Use Topics   Alcohol use: Never   Drug use: Yes    Types: Marijuana    Comment: daily    Family History  Problem Relation Age of Onset   Healthy Mother    Hypertension Father    Diabetes Father    Allergies  Allergen Reactions   Doxycycline     "nausea and lightheadedness"    Penicillins     OBJECTIVE: Vitals:   06/19/22 1010  BP: (!) 151/82  Pulse: 69  Temp: 98.4 F (36.9 C)  TempSrc: Oral  SpO2: 100%   Weight: 129 lb (58.5 kg)  Height: 5\' 7"  (1.702 m)   Body mass index is 20.2 kg/m.   Physical Exam Constitutional:      Comments: He is soft-spoken and very pleasant.  HENT:     Mouth/Throat:     Mouth: Mucous membranes are moist.     Pharynx: Oropharynx is clear. No oropharyngeal exudate or posterior oropharyngeal erythema.  Cardiovascular:     Rate and Rhythm: Normal rate and regular rhythm.     Heart sounds: No murmur heard. Pulmonary:     Effort: Pulmonary effort is normal.     Breath sounds: Normal breath sounds.  Abdominal:     Palpations: Abdomen is soft. There is no mass.     Tenderness: There is no abdominal tenderness.  Genitourinary:    Comments: He has a small firm nodule on the left side of his scrotum.  There is no drainage, fluctuance or erythema. Musculoskeletal:        General: No swelling or tenderness.  Skin:    Findings: No rash.  Psychiatric:        Mood and Affect: Mood normal.     Microbiology: No results found for this or any previous visit (from the past 240 hour(s)).  , MD [facility] Disease [facility] [phone] pager   [phone] cell 06/19/2022, 10:20 AM

## 2022-06-19 NOTE — Addendum Note (Signed)
Addended by: Harley Alto on: 06/19/2022 10:53 AM   Modules accepted: Orders

## 2022-06-20 LAB — CYTOLOGY, (ORAL, ANAL, URETHRAL) ANCILLARY ONLY
Chlamydia: NEGATIVE
Comment: NEGATIVE
Comment: NORMAL
Neisseria Gonorrhea: NEGATIVE

## 2022-06-20 LAB — RPR: RPR Ser Ql: NONREACTIVE

## 2022-06-20 LAB — HIV ANTIBODY (ROUTINE TESTING W REFLEX): HIV 1&2 Ab, 4th Generation: NONREACTIVE

## 2022-06-20 LAB — URINE CYTOLOGY ANCILLARY ONLY
Chlamydia: NEGATIVE
Comment: NEGATIVE
Comment: NORMAL
Neisseria Gonorrhea: NEGATIVE

## 2022-06-26 ENCOUNTER — Encounter: Payer: Self-pay | Admitting: Internal Medicine

## 2022-06-26 ENCOUNTER — Other Ambulatory Visit: Payer: Self-pay

## 2022-06-26 ENCOUNTER — Ambulatory Visit (INDEPENDENT_AMBULATORY_CARE_PROVIDER_SITE_OTHER): Payer: Medicaid Other | Admitting: Internal Medicine

## 2022-06-26 DIAGNOSIS — Z8619 Personal history of other infectious and parasitic diseases: Secondary | ICD-10-CM

## 2022-06-26 DIAGNOSIS — N492 Inflammatory disorders of scrotum: Secondary | ICD-10-CM

## 2022-06-26 DIAGNOSIS — F32A Depression, unspecified: Secondary | ICD-10-CM

## 2022-06-26 NOTE — Progress Notes (Signed)
        [facility]  Patient Active Problem List   Diagnosis Date Noted   Depression 06/19/2022   Exudative pharyngitis 06/18/2022   History of chlamydia 06/18/2022   Scrotal abscess 06/18/2022   Hypertension 06/18/2022   Tobacco use 06/18/2022   ADHD 06/18/2022    Patient's Medications  New Prescriptions   No medications on file  Previous Medications   LISINOPRIL (ZESTRIL) 10 MG TABLET    Take 1 tablet (10 mg total) by mouth daily.  Modified Medications   No medications on file  Discontinued Medications   No medications on file    Subjective: Darren Jones is in for his routine follow-up visit.  He has not had any further problems with dysuria or penile discharge.  The small knot on the left side of his scrotum is getting smaller.  He has not reached out to [facility] of the [place] to obtain counseling for his depression yet.  Review of Systems: Review of Systems  Constitutional:  Negative for fever and weight loss.  Genitourinary:  Negative for dysuria.  Skin:  Negative for rash.  Psychiatric/Behavioral:  Positive for depression.     Past Medical History:  Diagnosis Date   Heart murmur    Hypertension     Social History   Tobacco Use   Smoking status: Every Day    Types: Cigars   Smokeless tobacco: Never   Tobacco comments:    Black and milds (does more than one pack/day)  Vaping Use   Vaping Use: Never used  Substance Use Topics   Alcohol use: Never   Drug use: Yes    Types: Marijuana    Comment: daily    Family History  Problem Relation Age of Onset   Healthy Mother    Hypertension Father    Diabetes Father     Allergies  Allergen Reactions   Doxycycline     "nausea and lightheadedness"    Penicillins     Objective: Vitals:   06/26/22 1030  Weight: 129 lb (58.5 kg)   Body mass index is 20.2 kg/m.  Physical Exam Constitutional:      Comments: He is a very pleasant young man.  Cardiovascular:     Rate  and Rhythm: Normal rate.  Pulmonary:     Effort: Pulmonary effort is normal.  Skin:    Findings: No rash.  Psychiatric:        Mood and Affect: Mood normal.     Lab Results    Problem List Items Addressed This Visit       Unprioritized   History of chlamydia    Testing for chlamydia, GC, syphilis and HIV was negative.  His girlfriend recently tested negative for chlamydia.  He believes they are in a mutually monogamous relationship.      Scrotal abscess    His recent scrotal boil is resolving.  I reinforced the need to stop shaving his scrotum in order to cut down on the likelihood of recurrent infections.      Depression    He has been struggling with depression for the last several years and it has caused difficulty parenting his 2 young children.  Encouraged him to reach out to [facility] of the [place].        Cliffton Asters, MD [facility] for Infectious Disease [facility] [phone] pager   [phone] cell 06/26/2022, 10:45 AM

## 2022-06-26 NOTE — Assessment & Plan Note (Signed)
He has been struggling with depression for the last several years and it has caused difficulty parenting his 2 young children.  Encouraged him to reach out to [facility] of the [place].

## 2022-06-26 NOTE — Assessment & Plan Note (Signed)
Testing for chlamydia, GC, syphilis and HIV was negative.  His girlfriend recently tested negative for chlamydia.  He believes they are in a mutually monogamous relationship.

## 2022-06-26 NOTE — Assessment & Plan Note (Signed)
His recent scrotal boil is resolving.  I reinforced the need to stop shaving his scrotum in order to cut down on the likelihood of recurrent infections.

## 2022-07-27 IMAGING — DX DG ANKLE COMPLETE 3+V*R*
3 series · 3 of 3 positions shown · non-contrast
Comparison: None.

CLINICAL DATA: Injury

EXAM:
RIGHT ANKLE - COMPLETE 3+ VIEW

[ankle ap]
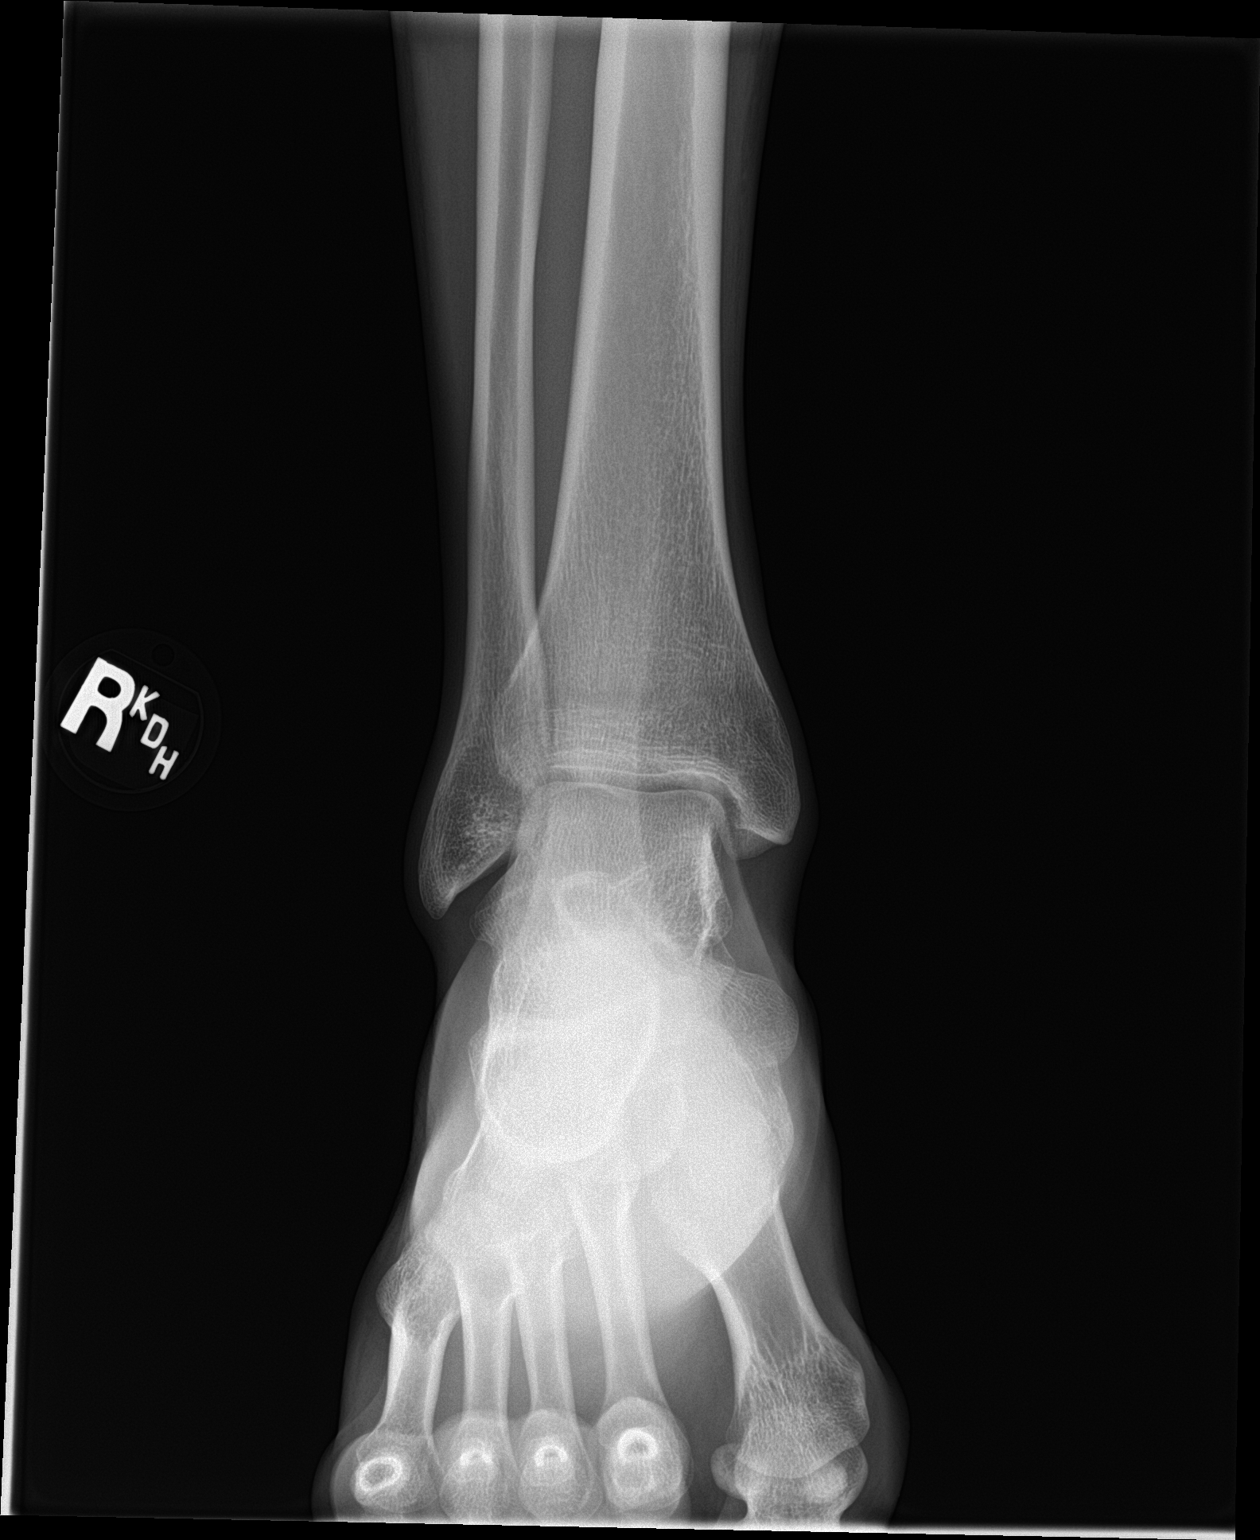

[ankle obl]
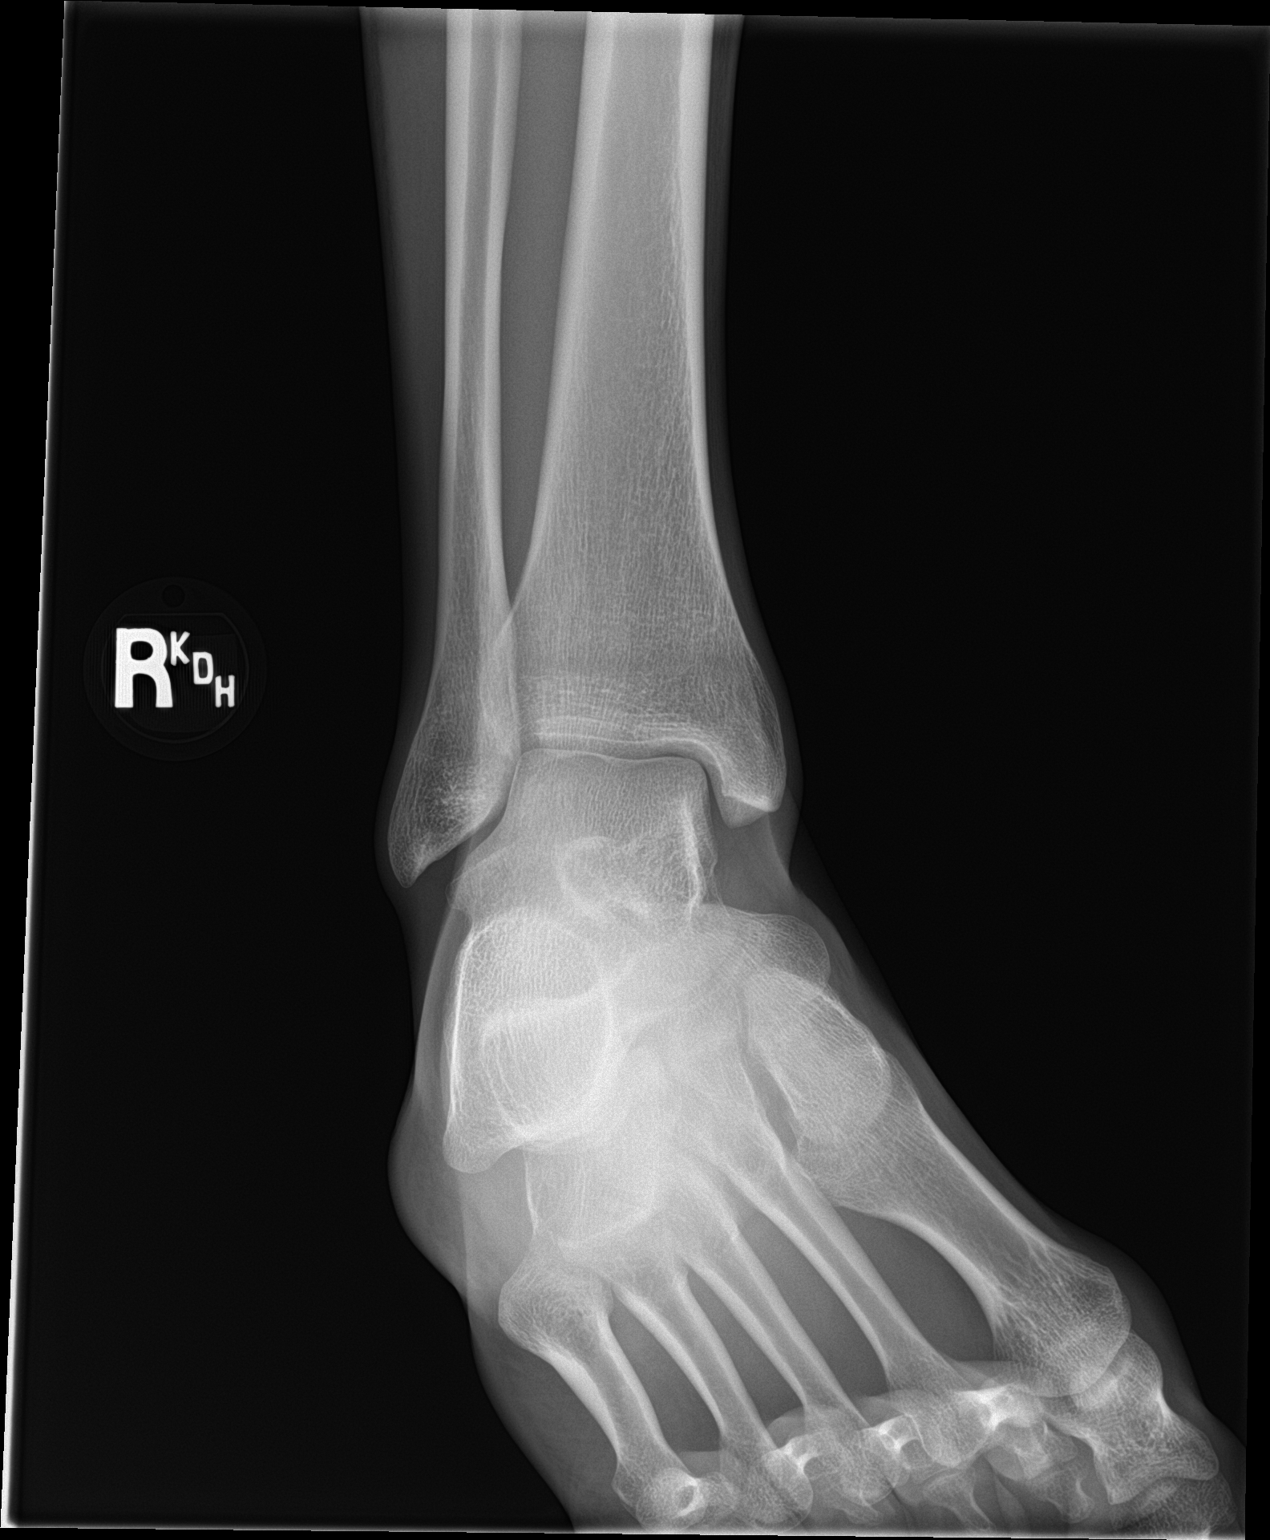

[ankle lat]
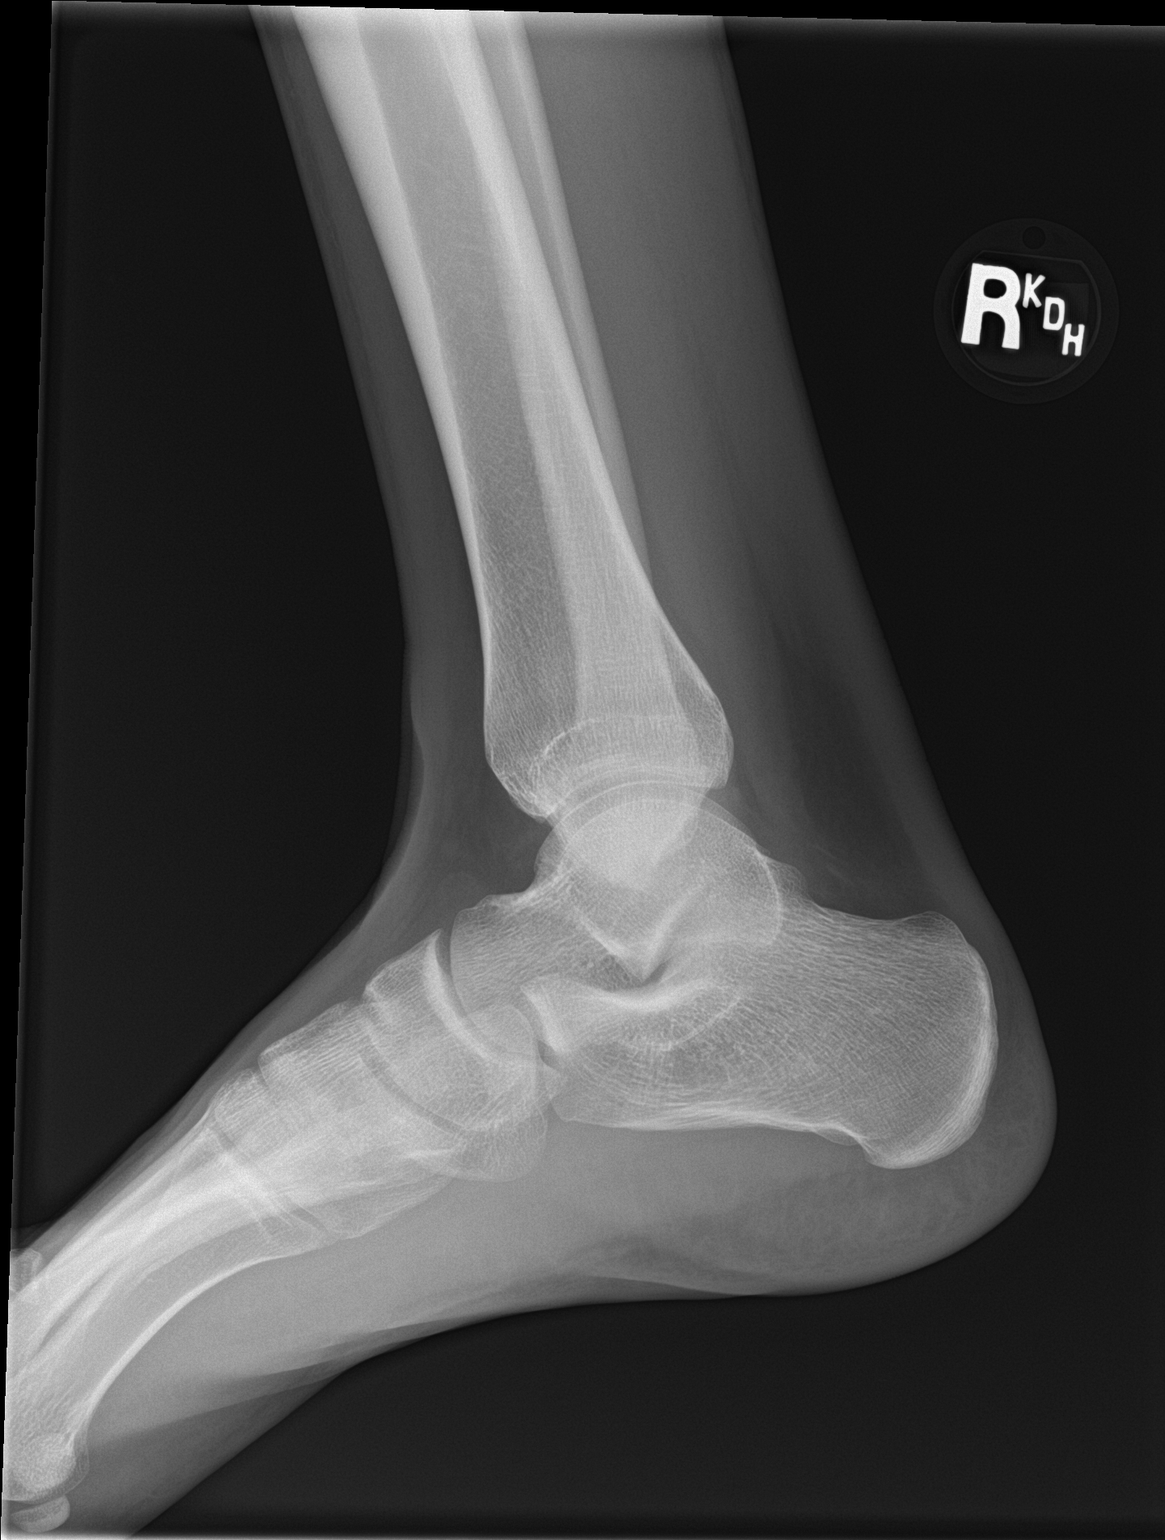

[3 of 3 positions shown; findings below may reference images not displayed]

FINDINGS: There is no evidence of fracture, dislocation, or joint effusion.
There is no evidence of arthropathy or other focal bone abnormality.
Soft tissues are unremarkable.
IMPRESSION: Negative.

## 2022-09-22 ENCOUNTER — Emergency Department: Payer: Medicaid Other

## 2022-09-22 ENCOUNTER — Ambulatory Visit
Admission: EM | Admit: 2022-09-22 | Discharge: 2022-09-22 | Disposition: A | Discharge status: Home / Self Care | Payer: Medicaid Other | Attending: Emergency Medicine | Admitting: Emergency Medicine

## 2022-09-22 ENCOUNTER — Emergency Department
Admission: EM | Admit: 2022-09-22 | Discharge: 2022-09-22 | Disposition: A | Discharge status: Home / Self Care | Payer: Medicaid Other | Attending: Emergency Medicine | Admitting: Emergency Medicine

## 2022-09-22 ENCOUNTER — Other Ambulatory Visit: Payer: Self-pay

## 2022-09-22 DIAGNOSIS — R0789 Other chest pain: Secondary | ICD-10-CM | POA: Insufficient documentation

## 2022-09-22 DIAGNOSIS — I10 Essential (primary) hypertension: Secondary | ICD-10-CM | POA: Diagnosis not present

## 2022-09-22 DIAGNOSIS — R110 Nausea: Secondary | ICD-10-CM

## 2022-09-22 DIAGNOSIS — R079 Chest pain, unspecified: Secondary | ICD-10-CM

## 2022-09-22 DIAGNOSIS — R0602 Shortness of breath: Secondary | ICD-10-CM | POA: Diagnosis not present

## 2022-09-22 DIAGNOSIS — R519 Headache, unspecified: Secondary | ICD-10-CM | POA: Diagnosis not present

## 2022-09-22 LAB — BASIC METABOLIC PANEL
Anion gap: 6 (ref 5–15)
BUN: 13 mg/dL (ref 6–20)
CO2: 26 mmol/L (ref 22–32)
Calcium: 9.2 mg/dL (ref 8.9–10.3)
Chloride: 106 mmol/L (ref 98–111)
Creatinine, Ser: 0.96 mg/dL (ref 0.61–1.24)
GFR, Estimated: >60 mL/min (ref >60)
Glucose, Bld: 103 mg/dL — ABNORMAL HIGH (ref 70–99)
Potassium: 4 mmol/L (ref 3.5–5.1)
Sodium: 138 mmol/L (ref 135–145)

## 2022-09-22 LAB — CBC
HCT: 43.7 % (ref 39.0–52.0)
Hemoglobin: 14.8 g/dL (ref 13.0–17.0)
MCH: 27.9 pg (ref 26.0–34.0)
MCHC: 33.9 g/dL (ref 30.0–36.0)
MCV: 82.5 fL (ref 80.0–100.0)
Platelets: 273 10*3/uL (ref 150–400)
RBC: 5.3 MIL/uL (ref 4.22–5.81)
RDW: 14.4 % (ref 11.5–15.5)
WBC: 5.5 10*3/uL (ref 4.0–10.5)
nRBC: 0 % (ref 0.0–0.2)

## 2022-09-22 LAB — TROPONIN I (HIGH SENSITIVITY): Troponin I (High Sensitivity): 3 ng/L (ref <18)

## 2022-09-22 MED ORDER — AMLODIPINE BESYLATE 2.5 MG PO TABS: 2.5000 mg | ORAL_TABLET | Freq: Every day | ORAL | 5 refills | Status: AC

## 2022-09-22 NOTE — ED Provider Notes (Signed)
Roderic Palau    CSN: [id number] Arrival date & time: 09/22/22  2353      History   Chief Complaint Chief Complaint  Patient presents with   Nausea   Headache    HPI Darren Jones is a 21 y.o. male.  Patient presents with chest pressure, shortness of breath, nausea, headache since about 0900 this morning.  The chest pressure has decreased.  At its worst it was 7/10; currently 4/10.  He denies focal weakness, numbness, diaphoresis, vision changes, dizziness, vomiting, or other symptoms.  No treatments at home.  His medical history includes hypertension and heart murmur.  Patient smokes marijuana; smoked last night.  The history is provided by the patient and medical records.    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 Authorizing Provider  lisinopril (ZESTRIL) 10 MG tablet Take 1 tablet (10 mg total) by mouth daily. Patient not taking: Reported on 06/19/2022 12/28/20   Luvenia Redden, PA-C  albuterol (VENTOLIN HFA) 108 (90 Base) MCG/ACT inhaler Inhale 2 puffs into the lungs every 4 (four) hours as needed for wheezing or shortness of breath. 07/16/20 08/09/20  Sharion Balloon, NP    Family History Family History  Problem Relation Age of Onset   Healthy Mother    Hypertension Father    Diabetes Father     Social History Social History   Tobacco Use   Smoking status: Every Day    Types: Cigars   Smokeless tobacco: Never   Tobacco comments:     Black and milds (does more than one pack/day)  Vaping Use   Vaping Use: Never used  Substance Use Topics   Alcohol use: Never   Drug use: Yes    Types: Marijuana    Comment: daily     Allergies   Doxycycline and Penicillins   Review of Systems Review of Systems  Constitutional:  Negative for chills and fever.  Eyes:  Negative for visual disturbance.  Respiratory:  Positive for shortness of breath. Negative for cough.   Cardiovascular:  Positive for chest pain. Negative for palpitations.  Gastrointestinal:  Positive for nausea. Negative for abdominal pain, constipation, diarrhea and vomiting.  Neurological:  Positive for headaches. Negative for dizziness, facial asymmetry, speech difficulty, weakness, light-headedness and numbness.  All other systems reviewed and are negative.    Physical Exam Triage Vital Signs ED Triage Vitals  Enc Vitals Group     BP 09/22/22 1138 129/85     Pulse Rate 09/22/22 1134 65     Resp 09/22/22 1134 18     Temp 09/22/22 1134 98.2 F (36.8 C)     Temp src --      SpO2 09/22/22 1134 98 %     Weight 09/22/22 1138 130 lb (59 kg)     Height 09/22/22 1138 5\' 7"  (1.702 m)     Head Circumference --      Peak  Flow --      Pain Score --      Pain Loc --      Pain Edu? --      Excl. in GC? --    No data found.  Updated Vital Signs BP 129/85   Pulse 65   Temp 98.2 F (36.8 C)   Resp 18   Ht 5\' 7"  (1.702 m)   Wt 130 lb (59 kg)   SpO2 98%   BMI 20.36 kg/m   Visual Acuity Right Eye Distance:   Left Eye Distance:   Bilateral Distance:    Right Eye Near:   Left Eye Near:    Bilateral Near:     Physical Exam Vitals and nursing note reviewed.  Constitutional:      General: He is not in acute distress.    Appearance: Normal appearance. He is well-developed. He is not ill-appearing.  HENT:     Mouth/Throat:     Mouth: Mucous membranes are moist.  Cardiovascular:     Rate and Rhythm: Normal rate and regular rhythm.     Heart  sounds: Normal heart sounds.  Pulmonary:     Effort: Pulmonary effort is normal. No respiratory distress.     Breath sounds: Normal breath sounds.  Abdominal:     General: Bowel sounds are normal.     Palpations: Abdomen is soft.     Tenderness: There is no abdominal tenderness.  Musculoskeletal:     Cervical back: Neck supple.     Right lower leg: No edema.     Left lower leg: No edema.  Skin:    General: Skin is warm and dry.  Neurological:     General: No focal deficit present.     Mental Status: He is alert and oriented to person, place, and time.     Sensory: No sensory deficit.     Motor: No weakness.     Gait: Gait normal.  Psychiatric:        Mood and Affect: Mood normal.        Behavior: Behavior normal.      UC Treatments / Results  Labs (all labs ordered are listed, but only abnormal results are displayed) Labs Reviewed - No data to display  EKG   Radiology No results found.  Procedures Procedures (including critical care time)  Medications Ordered in UC Medications - No data to display  Initial Impression / Assessment and Plan / UC Course  I have reviewed the triage vital signs and the nursing notes.  Pertinent labs & imaging results that were available during my care of the patient were reviewed by me and considered in my medical decision making (see chart for details).    Chest pressure, shortness of breath, nausea, headache.  EKG shows sinus bradycardia, rate 56, 1 mm ST elevation in leads V2 and V3, no previous to compare.  Patient declines EMS.  He states he will drive himself to the ED.  Final Clinical Impressions(s) / UC Diagnoses   Final diagnoses:  Chest pressure  Shortness of breath  Nausea  Acute nonintractable headache, unspecified headache type     Discharge Instructions      Go to the emergency department for evaluation of your chest pain and shortness of breath.     ED Prescriptions   None    PDMP not reviewed this  encounter.   , NP 09/22/22 1200

## 2022-09-22 NOTE — ED Provider Notes (Signed)
[facility] Provider Note    Event Date/Time   First MD Initiated Contact with Patient 09/22/22 1321     (approximate)  History   Chief Complaint: Chest Pain  HPI  Darren Jones is a 21 y.o. male with a past medical history of hypertension off of his blood pressure medications x2 years, presents to the emergency department for chest discomfort and high blood pressure.  According to the patient states he has been feeling "like crap" recently which she describes as intermittent discomfort in his chest and feeling fatigued.  He states he went to urgent care they found his blood pressure is elevated the patient stopped taking his blood pressure medication several years ago, they were worried so they sent the patient to the emergency department.  Here the patient appears well he states very slight chest pressure although denies any "pain."  Denies any shortness of breath no cough congestion or fever.  No pleuritic pain.  Physical Exam   Triage Vital Signs: ED Triage Vitals  Enc Vitals Group     BP 09/22/22 1240 (!) 140/92     Pulse Rate 09/22/22 1240 64     Resp 09/22/22 1240 18     Temp 09/22/22 1240 97.9 F (36.6 C)     Temp Source 09/22/22 1240 Oral     SpO2 09/22/22 1240 99 %     Weight --      Height --      Head Circumference --      Peak Flow --      Pain Score 09/22/22 1238 2     Pain Loc --      Pain Edu? --      Excl. in GC? --     Most recent vital signs: Vitals:   09/22/22 1240  BP: (!) 140/92  Pulse: 64  Resp: 18  Temp: 97.9 F (36.6 C)  SpO2: 99%    General: Awake, no distress.  CV:  Good peripheral perfusion.  Regular rate and rhythm  Resp:  Normal effort.  Equal breath sounds bilaterally.  Abd:  No distention.  Soft, nontender.  No rebound or guarding.   ED Results / Procedures / Treatments   EKG  EKG viewed and interpreted by myself shows sinus rhythm at 62 bpm with a narrow QRS, normal axis, normal intervals, no  concerning ST changes.  RADIOLOGY  I have reviewed and interpreted the chest x-ray images.  No obvious consolidation seen on my evaluation. Radiology has read the chest x-ray as negative.   MEDICATIONS ORDERED IN ED: Medications - No data to display   IMPRESSION / MDM / ASSESSMENT AND PLAN / ED COURSE  I reviewed the triage vital signs and the nursing notes.  Patient's presentation is most consistent with acute presentation with potential threat to life or bodily function.  Patient presents emergency department for chest pain and high blood pressure.  Overall the patient appears well states very slight chest pressure currently denies any pain.  Patient's work-up is reassuring with a normal CBC, reassuring chemistry and a negative troponin.  EKG is reassuring and chest x-ray is clear.  Patient's blood pressure is mildly elevated 140/92 however given the blood pressure elevation and the fact that the patient is prescribed blood pressure medication that he has not taken in 2 years I believe it is reasonable to start the patient on a low-dose blood pressure medication such as 2.5 mg of amlodipine daily.  I discussed with the  patient the importance of checking his blood pressure at a pharmacy for the next several weeks to ensure that the blood pressure does not drop below 120/80 if it does the patient is to discontinue the medication.  Patient states he has a primary care doctor in [place] but has not seen him in 2 to 3 years.  He will call to make an appointment.  Given the patient's reassuring work-up reassuring physical exam and reassuring vitals I believe the patient will be safe for discharge home with PCP follow-up.  We will start the patient on low-dose amlodipine while awaiting his PCP visit.  Patient agreeable to plan of care.  FINAL CLINICAL IMPRESSION(S) / ED DIAGNOSES   Chest pain Hypertension  Rx / DC Orders   Amlodipine 2.5 mg daily  Note:  This document was prepared using Dragon  voice recognition software and may include unintentional dictation errors.   Harvest Dark, MD 09/22/22 1343

## 2022-09-22 NOTE — ED Triage Notes (Addendum)
Patient to Urgent Care with complaints of hypertension, reports feeling nauseous and fatigued. States that he did not take his BP but knew it was high.   Patient is prescribed lisinopril but has not been taking it- last dose was several months ago.

## 2022-09-22 NOTE — ED Notes (Signed)
Patient is being discharged from the Urgent Care and sent to the Emergency Department via POV . Per Barkley Boards, NP, patient is in need of higher level of care due to chest pain. Patient is aware and verbalizes understanding of plan of care.  Vitals:   09/22/22 1134 09/22/22 1138  BP:  129/85  Pulse: 65   Resp: 18   Temp: 98.2 F (36.8 C)   SpO2: 98%

## 2022-09-22 NOTE — ED Provider Triage Note (Signed)
Emergency Medicine Provider Triage Evaluation Note  Darren Jones , a 21 y.o. male  was evaluated in triage.  Pt complains of chest pain x 2 days. History of hypertension but has been out of meds for about a year.  Physical Exam  There were no vitals taken for this visit. Gen:   Awake, no distress   Resp:  Normal effort  MSK:   Moves extremities without difficulty  Other:    Medical Decision Making  Medically screening exam initiated at 12:38 PM.  Appropriate orders placed.  Darren Jones was informed that the remainder of the evaluation will be completed by another provider, this initial triage assessment does not replace that evaluation, and the importance of remaining in the ED until their evaluation is complete.    Darren Dike, FNP 09/22/22 1432

## 2022-09-22 NOTE — Discharge Instructions (Signed)
Go to the emergency department for evaluation of your chest pain and shortness of breath.    

## 2022-09-22 NOTE — ED Triage Notes (Signed)
Pt arrived POV from urgent care c/o intermittent chest pain 2/10, reports "high BP in the 120s". H/O HTN on lisinopril hasn't taken it for more than a year.

## 2023-10-09 ENCOUNTER — Ambulatory Visit
Admission: EM | Admit: 2023-10-09 | Discharge: 2023-10-09 | Disposition: A | Discharge status: Home / Self Care | Payer: Medicaid Other | Attending: Emergency Medicine | Admitting: Emergency Medicine

## 2023-10-09 DIAGNOSIS — R300 Dysuria: Secondary | ICD-10-CM | POA: Insufficient documentation

## 2023-10-09 DIAGNOSIS — R3129 Other microscopic hematuria: Secondary | ICD-10-CM | POA: Diagnosis present

## 2023-10-09 DIAGNOSIS — I10 Essential (primary) hypertension: Secondary | ICD-10-CM | POA: Diagnosis not present

## 2023-10-09 DIAGNOSIS — Z113 Encounter for screening for infections with a predominantly sexual mode of transmission: Secondary | ICD-10-CM | POA: Diagnosis not present

## 2023-10-09 LAB — POCT URINALYSIS DIP (MANUAL ENTRY)
Glucose, UA: NEGATIVE mg/dL
Ketones, POC UA: NEGATIVE mg/dL
Leukocytes, UA: NEGATIVE
Nitrite, UA: NEGATIVE
Spec Grav, UA: 1.02 (ref 1.010–1.025)
Urobilinogen, UA: 1 U/dL
pH, UA: 7 (ref 5.0–8.0)

## 2023-10-09 NOTE — Discharge Instructions (Addendum)
Your urine does not show infection.  It did show trace blood.  Please have your urine rechecked by your primary care provider.    Your STD tests are pending.  If your test results are positive, we will call you.  You and your sexual partner(s) may require treatment at that time.  Do not have sexual activity for at least 7 days.    Your blood pressure is elevated today at 149/99; repeat 153/84.  Please have this rechecked by your primary care provider in 2-4 weeks.

## 2023-10-09 NOTE — ED Provider Notes (Addendum)
[name]    CSN: [id number] Arrival date & time: 10/09/23  4098      History   Chief Complaint Chief Complaint  Patient presents with   SEXUALLY TRANSMITTED DISEASE    HPI Darren Jones is a 22 y.o. male.  Patient presents with 2-day history of dysuria and low back pain.  He denies fever, hematuria, abdominal pain, flank pain, testicular pain, penile discharge, or other symptoms.  No treatments at home.   Patient was seen at [facility] on 09/27/2023; diagnosed with exposure to chlamydia and increased urinary frequency; negative for gonorrhea, chlamydia, trichomonas; urine negative.   His medical history includes hypertension.  The history is provided by the patient and medical records.    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 Authorizing Provider  amLODipine (NORVASC) 2.5 MG tablet Take 1 tablet (2.5 mg total) by mouth daily. Patient not taking: Reported on 10/09/2023 09/22/22 09/22/23  Minna Antis, MD  lisinopril (ZESTRIL) 10 MG tablet Take 1 tablet (10 mg total) by mouth daily. Patient not taking: Reported on 06/19/2022 12/28/20   Candis Schatz, PA-C  albuterol (VENTOLIN HFA) 108 (90 Base) MCG/ACT inhaler Inhale 2 puffs into the lungs every 4 (four) hours as needed for wheezing or shortness of breath. 07/16/20 08/09/20  Mickie Bail, NP    Family History Family History  Problem Relation Age of Onset   Healthy Mother    Hypertension  Father    Diabetes Father     Social History Social History   Tobacco Use   Smoking status: Every Day    Types: Cigars   Smokeless tobacco: Never   Tobacco comments:    Black and milds (does more than one pack/day)  Vaping Use   Vaping status: Never Used  Substance Use Topics   Alcohol use: Never   Drug use: Yes    Types: Marijuana    Comment: daily     Allergies   Doxycycline and Penicillins   Review of Systems Review of Systems  Constitutional:  Negative for chills and fever.  Gastrointestinal:  Negative for abdominal pain, diarrhea, nausea and vomiting.  Genitourinary:  Positive for dysuria. Negative for flank pain, hematuria, penile discharge and testicular pain.  Skin:  Negative for color change and rash.     Physical Exam Triage Vital Signs ED Triage Vitals [10/09/23 0930]  Encounter Vitals Group     BP      Systolic BP Percentile      Diastolic BP Percentile      Pulse Rate 80     Resp 18     Temp 98.2 F (36.8 C)     Temp src      SpO2 97 %     Weight      Height      Head Circumference      Peak Flow      Pain Score  Pain Loc      Pain Education      Exclude from Growth Chart    No data found.  Updated Vital Signs BP (!) 153/84   Pulse 80   Temp 98.2 F (36.8 C)   Resp 18   SpO2 97%   Visual Acuity Right Eye Distance:   Left Eye Distance:   Bilateral Distance:    Right Eye Near:   Left Eye Near:    Bilateral Near:     Physical Exam Constitutional:      General: He is not in acute distress. HENT:     Mouth/Throat:     Mouth: Mucous membranes are moist.  Cardiovascular:     Rate and Rhythm: Normal rate and regular rhythm.     Heart sounds: Normal heart sounds.  Pulmonary:     Effort: Pulmonary effort is normal. No respiratory distress.     Breath sounds: Normal breath sounds.  Abdominal:     General: Bowel sounds are normal.     Palpations: Abdomen is soft.     Tenderness: There is no abdominal tenderness. There is  no right CVA tenderness, left CVA tenderness, guarding or rebound.  Genitourinary:    Comments: Patient declines GU exam. Skin:    General: Skin is warm and dry.  Neurological:     Mental Status: He is alert.      UC Treatments / Results  Labs (all labs ordered are listed, but only abnormal results are displayed) Labs Reviewed  POCT URINALYSIS DIP (MANUAL ENTRY) - Abnormal; Notable for the following components:      Result Value   Bilirubin, UA small (*)    Blood, UA trace-intact (*)    Protein Ur, POC trace (*)    All other components within normal limits  CYTOLOGY, (ORAL, ANAL, URETHRAL) ANCILLARY ONLY    EKG   Radiology No results found.  Procedures Procedures (including critical care time)  Medications Ordered in UC Medications - No data to display  Initial Impression / Assessment and Plan / UC Course  I have reviewed the triage vital signs and the nursing notes.  Pertinent labs & imaging results that were available during my care of the patient were reviewed by me and considered in my medical decision making (see chart for details).    Dysuria, screening for STDs, elevated blood pressure reading with hypertension, hematuria.  Patient obtained urethral self swab for STD testing.  Instructed him to abstain from sexual activity until the test results are back.  Discussed with patient that his urine does not show signs of infection today but does have trace blood.  Instructed patient to have this rechecked by his PCP.  Education provided on dysuria and hematuria.  Also discussed with patient that his blood pressure is elevated today and needs to be rechecked by his PCP.  Education provided on managing hypertension.  Patient agrees to plan of care.  Final Clinical Impressions(s) / UC Diagnoses   Final diagnoses:  Dysuria  Screening for STD (sexually transmitted disease)  Elevated blood pressure reading in office with diagnosis of hypertension  Other microscopic  hematuria     Discharge Instructions      Your urine does not show infection.  It did show trace blood.  Please have your urine rechecked by your primary care provider.    Your STD tests are pending.  If your test results are positive, we will call you.  You and your sexual partner(s) may require treatment  at that time.  Do not have sexual activity for at least 7 days.    Your blood pressure is elevated today at 149/99; repeat 153/84.  Please have this rechecked by your primary care provider in 2-4 weeks.          ED Prescriptions   None    PDMP not reviewed this encounter.   Mickie Bail, NP 10/09/23 1019    Mickie Bail, NP 10/09/23 1020

## 2023-10-09 NOTE — ED Triage Notes (Addendum)
Patient to Urgent Care for STD testing. Denies any known exposures.   Has had some burning at the end of urination. Some lower back pain. Denies any penile discharge.   Symptoms started 2-3 days ago.

## 2023-10-12 ENCOUNTER — Ambulatory Visit
Admission: RE | Admit: 2023-10-12 | Discharge: 2023-10-12 | Disposition: A | Discharge status: Home / Self Care | Payer: Medicaid Other | Source: Ambulatory Visit | Attending: Family Medicine | Admitting: Family Medicine

## 2023-10-12 VITALS — BP 122/86 | HR 82 | Temp 99.1°F | Resp 18

## 2023-10-12 DIAGNOSIS — Z202 Contact with and (suspected) exposure to infections with a predominantly sexual mode of transmission: Secondary | ICD-10-CM

## 2023-10-12 LAB — CYTOLOGY, (ORAL, ANAL, URETHRAL) ANCILLARY ONLY
Chlamydia: NEGATIVE
Comment: NEGATIVE
Comment: NEGATIVE
Comment: NORMAL
Neisseria Gonorrhea: NEGATIVE
Trichomonas: NEGATIVE

## 2023-10-12 NOTE — ED Provider Notes (Signed)
Darren Jones    CSN: [id number] Arrival date & time: 10/12/23  1554      History   Chief Complaint Chief Complaint  Patient presents with   Exposure to STD    Std blood test - Entered by patient    HPI Darren Jones is a 22 y.o. male.    Exposure to STD  Here for further STD testing.  He was seen here in November 15 which was a Friday.  Cytology swab was done for STD testing and is not resulted yet.  It is still in process epic.  The hospital does not work on those lab tests over the weekend on Saturdays or Sundays.  He would like to get blood work done to check HIV and RPR.  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 Authorizing Provider  amLODipine (NORVASC) 2.5 MG tablet Take 1 tablet (2.5 mg total) by mouth daily. Patient not taking: Reported on 10/09/2023 09/22/22 09/22/23  Minna Antis, MD  lisinopril (ZESTRIL) 10 MG tablet Take 1 tablet (10 mg total) by mouth daily. Patient not taking: Reported on 06/19/2022 12/28/20   Candis Schatz, PA-C  albuterol (VENTOLIN HFA) 108 (90 Base) MCG/ACT inhaler Inhale 2 puffs into the lungs every 4 (four) hours as needed for wheezing or shortness of breath. 07/16/20 08/09/20  Mickie Bail, NP    Family History Family History  Problem Relation Age of Onset   Healthy Mother    Hypertension Father    Diabetes Father     Social History Social History   Tobacco Use   Smoking status: Every Day     Types: Cigars   Smokeless tobacco: Never   Tobacco comments:    Black and milds (does more than one pack/day)  Vaping Use   Vaping status: Never Used  Substance Use Topics   Alcohol use: Never   Drug use: Yes    Types: Marijuana    Comment: daily     Allergies   Doxycycline and Penicillins   Review of Systems Review of Systems   Physical Exam Triage Vital Signs ED Triage Vitals  Encounter Vitals Group     BP      Systolic BP Percentile      Diastolic BP Percentile      Pulse      Resp      Temp      Temp src      SpO2      Weight      Height      Head Circumference      Peak Flow      Pain Score      Pain Loc      Pain Education      Exclude from Growth Chart    No data found.  Updated Vital Signs There were no vitals taken for this visit.  Visual Acuity Right Eye Distance:  Left Eye Distance:   Bilateral Distance:    Right Eye Near:   Left Eye Near:    Bilateral Near:     Physical Exam Vitals reviewed.  Constitutional:      General: He is not in acute distress.    Appearance: He is not ill-appearing, toxic-appearing or diaphoretic.  Skin:    Coloration: Skin is not pale.  Neurological:     Mental Status: He is alert and oriented to person, place, and time.  Psychiatric:        Behavior: Behavior normal.      UC Treatments / Results  Labs (all labs ordered are listed, but only abnormal results are displayed) Labs Reviewed  HIV ANTIBODY (ROUTINE TESTING W REFLEX)  RPR    EKG   Radiology No results found.  Procedures Procedures (including critical care time)  Medications Ordered in UC Medications - No data to display  Initial Impression / Assessment and Plan / UC Course  I have reviewed the triage vital signs and the nursing notes.  Pertinent labs & imaging results that were available during my care of the patient were reviewed by me and considered in my medical decision making (see chart for details).   Blood work is  drawn to check HIV and RPR.  Staff will notify him if anything is positive.  I have discussed the wait times for results on the cytology swab also. Final Clinical Impressions(s) / UC Diagnoses   Final diagnoses:  Exposure to STD     Discharge Instructions      We have drawn blood today for HIV and syphilis tests.  They should result in the next 1 to 2 days, and our staff will call you if anything is positive.     ED Prescriptions   None    PDMP not reviewed this encounter.   Zenia Resides, MD 10/12/23 [phone]

## 2023-10-12 NOTE — Discharge Instructions (Signed)
We have drawn blood today for HIV and syphilis tests.  They should result in the next 1 to 2 days, and our staff will call you if anything is positive.

## 2023-10-13 LAB — RPR: RPR Ser Ql: NONREACTIVE

## 2023-10-13 LAB — HIV ANTIBODY (ROUTINE TESTING W REFLEX): HIV Screen 4th Generation wRfx: NONREACTIVE

## 2024-05-22 ENCOUNTER — Ambulatory Visit
Admission: EM | Admit: 2024-05-22 | Discharge: 2024-05-22 | Disposition: A | Discharge status: Home / Self Care | Attending: Physician Assistant | Admitting: Physician Assistant

## 2024-05-22 DIAGNOSIS — L089 Local infection of the skin and subcutaneous tissue, unspecified: Secondary | ICD-10-CM

## 2024-05-22 DIAGNOSIS — L02214 Cutaneous abscess of groin: Secondary | ICD-10-CM

## 2024-05-22 MED ORDER — SULFAMETHOXAZOLE-TRIMETHOPRIM 800-160 MG PO TABS: 1.0000 | ORAL_TABLET | Freq: Two times a day (BID) | ORAL | 0 refills | Status: AC

## 2024-05-22 NOTE — ED Triage Notes (Signed)
 Patient to Urgent Care with complaints of an abscess to the right side of his groin. Reports feeling flu-like/ run down. Possible fevers. No drainage from abscess. Also feels he has a swollen lymph node in his groin.  Symptoms x2 days. Recurrent issue over the last month with multiple abscesses in the same area. Using hot compresses/ attempting to drain the area.   Taking ibuprofen .

## 2024-05-22 NOTE — Discharge Instructions (Signed)
 Start Bactrim  DS twice daily for 10 days.  If you develop any rash or lesions stop medication and be seen immediately.  Continue with warm compresses.  Follow-up with dermatology as we discussed.  If your symptoms are not improving please return for reevaluation.  If anything worsens and this lesion becomes larger, more painful, involves your scrotum or testicle, you develop fever, nausea/vomiting you need to go to the ER immediately.

## 2024-05-22 NOTE — ED Provider Notes (Signed)
 Darren Jones    CSN: [id number] Arrival date & time: 05/22/24  0827      History   Chief Complaint Chief Complaint  Patient presents with   Abscess    HPI Darren Jones is a 23 y.o. male.   Patient presents today with a 2-day history of enlarging groin in his right inguinal crease near his testicle.  He reports that this is painful with pain rated 10 on a 0-10 pain scale, described as aching, no aggravating or alleviating factors identified.  He reports that for the past several months he has had intermittent lesions in his groin.  He has never seen a dermatologist or been diagnosed with hidradenitis suppurativa.  He does report applying hot compresses as well as taking over-the-counter medication including Tylenol  ibuprofen  without improvement of symptoms.  He has noted a swollen lymph node in the region as well.  He does report feeling poorly and wonders if he is running fever; he has not recorded his temperature.  He denies any recent antibiotics.  He denies any history of MRSA.  Denies any history of diabetes or immunosuppression.  He has been able to express drainage from other lesions that is generally a combination of sanguinous and purulent.  There is not been any drainage from the current lesion.    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 Authorizing Provider  sulfamethoxazole -trimethoprim  (BACTRIM  DS) 800-160 MG tablet Take 1 tablet by mouth 2 (two)  times daily for 10 days. 05/22/24 06/01/24 Yes Savina Olshefski K, PA-C  amLODipine  (NORVASC ) 2.5 MG tablet Take 1 tablet (2.5 mg total) by mouth daily. Patient not taking: Reported on 10/09/2023 09/22/22 09/22/23  Dorothyann Drivers, MD  lisinopril  (ZESTRIL ) 10 MG tablet Take 1 tablet (10 mg total) by mouth daily. Patient not taking: Reported on 06/19/2022 12/28/20   Arloa Ozell BIRCH, PA-C  albuterol  (VENTOLIN  HFA) 108 (90 Base) MCG/ACT inhaler Inhale 2 puffs into the lungs every 4 (four) hours as needed for wheezing or shortness of breath. 07/16/20 08/09/20  Corlis Burnard DEL, NP    Family History Family History  Problem Relation Age of Onset   Healthy Mother    Hypertension Father    Diabetes Father     Social History Social History   Tobacco Use   Smoking status: Every Day    Types: Cigars   Smokeless tobacco: Never   Tobacco comments:    Black and milds (does more than one pack/day)  Vaping Use   Vaping status: Never Used  Substance Use Topics   Alcohol use: Never   Drug use: Yes    Types: Marijuana    Comment: daily     Allergies   Doxycycline  and Penicillins   Review of Systems Review of Systems  Constitutional:  Positive for activity change, fatigue and fever (Subjective). Negative for appetite change.  Gastrointestinal:  Negative for abdominal pain, diarrhea, nausea and vomiting.  Skin:  Positive for color change and wound.     Physical Exam Triage Vital Signs ED Triage Vitals  Encounter Vitals Group     BP 05/22/24 0910 (!) 148/84     Girls Systolic BP Percentile --      Girls Diastolic BP Percentile --      Boys Systolic BP Percentile --      Boys Diastolic BP Percentile --      Pulse Rate 05/22/24 0910 92     Resp 05/22/24 0910 18     Temp 05/22/24 0910 97.7 F (36.5 C)     Temp src --      SpO2 05/22/24 0910 97 %     Weight --      Height --      Head Circumference --      Peak Flow --      Pain Score 05/22/24 0914 10     Pain Loc --      Pain  Education --      Exclude from Growth Chart --    No data found.  Updated Vital Signs BP (!) 148/84   Pulse 92   Temp 97.7 F (36.5 C)   Resp 18   SpO2 97%   Visual Acuity Right Eye Distance:   Left Eye Distance:   Bilateral Distance:    Right Eye Near:   Left Eye Near:    Bilateral Near:     Physical Exam Vitals reviewed.  Constitutional:      General: He is awake.     Appearance: Normal appearance. He is well-developed. He is not ill-appearing.     Comments: Very pleasant male appears stated age in no acute distress sitting comfortably in exam room  HENT:     Head: Normocephalic and atraumatic.   Cardiovascular:     Rate and Rhythm: Normal rate and regular rhythm.     Heart sounds: No murmur heard. Pulmonary:     Effort: Pulmonary effort is normal.     Breath sounds: Normal breath sounds. No stridor. No wheezing, rhonchi or rales.  Abdominal:     Palpations: Abdomen is soft.     Tenderness: There is no abdominal tenderness.   Skin:    General: Skin is warm.     Findings: Abscess present.         Comments: Approximately 3 cm x 2 cm indurated nodule noted right inguinal crease with extension towards scrotum.  No fluctuance noted.  No active bleeding or drainage noted.  Multiple healed scars noted in groin.   Neurological:     Mental Status: He is alert.   Psychiatric:        Behavior: Behavior is cooperative.      UC Treatments / Results  Labs (all labs ordered are listed, but only abnormal results are displayed) Labs Reviewed - No data to display  EKG   Radiology No results found.  Procedures Procedures (including critical care time)  Medications Ordered in UC Medications - No data to display  Initial Impression / Assessment and Plan / UC Course  I have reviewed the triage vital signs and the nursing notes.  Pertinent labs & imaging results that were available during my care of the patient were reviewed by me and considered in my medical  decision making (see chart for details).     Patient is well-appearing, afebrile, nontoxic, nontachycardic.  There is no significant fluctuance and so we discussed that given the sensitive nature of  this area I am not comfortable draining as it feels immature on exam.  We will start Bactrim  DS twice daily for 10 days.  We discussed that if he develops any rash or lesions he should stop the medication and be seen immediately.  Given his recurrent abscesses I did recommend that he follow-up with dermatology to consider evaluation for hidradenitis suppurativa.  He was given the contact information for local provider with instruction to call to schedule an appointment.  He is to continue with warm compresses and use over-the-counter analgesics for pain relief.  We discussed that if this lesion is not improving quickly with conservative treatment measures he should return for reevaluation and consider I&D.  If anything worsens and this enlarges, involves his scrotum/testicular region, develops fever, nausea, vomiting he is to go to the emergency room immediately for further evaluation and management.  Final Clinical Impressions(s) / UC Diagnoses   Final diagnoses:  Recurrent infection of skin  Abscess of groin, right     Discharge Instructions      Start Bactrim  DS twice daily for 10 days.  If you develop any rash or lesions stop medication and be seen immediately.  Continue with warm compresses.  Follow-up with dermatology as we discussed.  If your symptoms are not improving please return for reevaluation.  If anything worsens and this lesion becomes larger, more painful, involves your scrotum or testicle, you develop fever, nausea/vomiting you need to go to the ER immediately.     ED Prescriptions     Medication Sig Dispense Auth. Provider   sulfamethoxazole -trimethoprim  (BACTRIM  DS) 800-160 MG tablet Take 1 tablet by mouth 2 (two) times daily for 10 days. 20 tablet Kaylamarie Swickard K, PA-C       I have reviewed the PDMP during this encounter.   Sherrell Rocky POUR, PA-C 05/22/24 9053
